# Patient Record
Sex: Male | Born: 1965 | Race: Black or African American | Hispanic: No | Marital: Married | Smoking: Former smoker
Health system: Southern US, Community
[De-identification: ages and names within clinical notes are randomized; demographics above are authoritative.]

## PROBLEM LIST (undated history)

## (undated) DIAGNOSIS — I209 Angina pectoris, unspecified: Secondary | ICD-10-CM

## (undated) DIAGNOSIS — E119 Type 2 diabetes mellitus without complications: Secondary | ICD-10-CM

## (undated) DIAGNOSIS — R0989 Other specified symptoms and signs involving the circulatory and respiratory systems: Secondary | ICD-10-CM

## (undated) DIAGNOSIS — M5430 Sciatica, unspecified side: Secondary | ICD-10-CM

## (undated) DIAGNOSIS — I10 Essential (primary) hypertension: Secondary | ICD-10-CM

## (undated) DIAGNOSIS — R51 Headache: Secondary | ICD-10-CM

## (undated) DIAGNOSIS — E7800 Pure hypercholesterolemia, unspecified: Secondary | ICD-10-CM

## (undated) DIAGNOSIS — R519 Headache, unspecified: Secondary | ICD-10-CM

## (undated) DIAGNOSIS — I2510 Atherosclerotic heart disease of native coronary artery without angina pectoris: Secondary | ICD-10-CM

## (undated) DIAGNOSIS — I201 Angina pectoris with documented spasm: Secondary | ICD-10-CM

## (undated) HISTORY — PX: INGUINAL HERNIA REPAIR: SUR1180

## (undated) HISTORY — DX: Other specified symptoms and signs involving the circulatory and respiratory systems: R09.89

---

## 2012-01-10 HISTORY — PX: CORONARY ANGIOPLASTY WITH STENT PLACEMENT: SHX49

## 2012-01-25 ENCOUNTER — Inpatient Hospital Stay (HOSPITAL_BASED_OUTPATIENT_CLINIC_OR_DEPARTMENT_OTHER)
Admission: EM | Admit: 2012-01-25 | Discharge: 2012-01-27 | DRG: 247 | Disposition: A | Discharge status: Home / Self Care | Payer: 59 | Attending: Cardiology | Admitting: Cardiology

## 2012-01-25 ENCOUNTER — Encounter (HOSPITAL_BASED_OUTPATIENT_CLINIC_OR_DEPARTMENT_OTHER): Payer: Self-pay | Admitting: *Deleted

## 2012-01-25 ENCOUNTER — Emergency Department (HOSPITAL_BASED_OUTPATIENT_CLINIC_OR_DEPARTMENT_OTHER): Payer: 59

## 2012-01-25 DIAGNOSIS — K219 Gastro-esophageal reflux disease without esophagitis: Secondary | ICD-10-CM | POA: Diagnosis present

## 2012-01-25 DIAGNOSIS — I214 Non-ST elevation (NSTEMI) myocardial infarction: Principal | ICD-10-CM | POA: Diagnosis present

## 2012-01-25 DIAGNOSIS — Z720 Tobacco use: Secondary | ICD-10-CM | POA: Diagnosis present

## 2012-01-25 DIAGNOSIS — I2510 Atherosclerotic heart disease of native coronary artery without angina pectoris: Secondary | ICD-10-CM | POA: Diagnosis present

## 2012-01-25 DIAGNOSIS — I10 Essential (primary) hypertension: Secondary | ICD-10-CM | POA: Diagnosis present

## 2012-01-25 DIAGNOSIS — F17200 Nicotine dependence, unspecified, uncomplicated: Secondary | ICD-10-CM | POA: Diagnosis present

## 2012-01-25 HISTORY — DX: Atherosclerotic heart disease of native coronary artery without angina pectoris: I25.10

## 2012-01-25 HISTORY — DX: Angina pectoris, unspecified: I20.9

## 2012-01-25 HISTORY — DX: Essential (primary) hypertension: I10

## 2012-01-25 LAB — CBC
Hemoglobin: 13.6 g/dL (ref 13.0–17.0)
MCH: 24.8 pg — ABNORMAL LOW (ref 26.0–34.0)
MCHC: 34.3 g/dL (ref 30.0–36.0)
MCV: 72.3 fL — ABNORMAL LOW (ref 78.0–100.0)
RBC: 5.49 MIL/uL (ref 4.22–5.81)

## 2012-01-25 LAB — BASIC METABOLIC PANEL
BUN: 10 mg/dL (ref 6–23)
CO2: 26 mEq/L (ref 19–32)
Calcium: 9.1 mg/dL (ref 8.4–10.5)
Creatinine, Ser: 1 mg/dL (ref 0.50–1.35)
GFR calc non Af Amer: 90 mL/min — ABNORMAL LOW (ref >90)
Glucose, Bld: 99 mg/dL (ref 70–99)
Sodium: 138 mEq/L (ref 135–145)

## 2012-01-25 LAB — CARDIAC PANEL(CRET KIN+CKTOT+MB+TROPI)
CK, MB: 9.2 ng/mL (ref 0.3–4.0)
Relative Index: 5.9 — ABNORMAL HIGH (ref 0.0–2.5)
Total CK: 156 U/L (ref 7–232)

## 2012-01-25 MED ORDER — GI COCKTAIL ~~LOC~~
30.0000 mL | Freq: Once | ORAL | Status: AC
  Administered 2012-01-25: 30 mL via ORAL
  Filled 2012-01-25: qty 30

## 2012-01-25 MED ORDER — NITROGLYCERIN 0.4 MG SL SUBL
0.4000 mg | SUBLINGUAL_TABLET | SUBLINGUAL | Status: DC | PRN
  Administered 2012-01-25: 0.4 mg via SUBLINGUAL
  Filled 2012-01-25: qty 25

## 2012-01-25 MED ORDER — ASPIRIN 81 MG PO CHEW
324.0000 mg | CHEWABLE_TABLET | Freq: Once | ORAL | Status: AC
  Administered 2012-01-25: 324 mg via ORAL
  Filled 2012-01-25: qty 4

## 2012-01-25 MED ORDER — HEPARIN BOLUS VIA INFUSION
4000.0000 [IU] | Freq: Once | INTRAVENOUS | Status: AC
  Administered 2012-01-25: 4000 [IU] via INTRAVENOUS

## 2012-01-25 MED ORDER — NITROGLYCERIN 0.4 MG SL SUBL: 0.4000 mg | SUBLINGUAL_TABLET | SUBLINGUAL | Status: DC | PRN

## 2012-01-25 MED ORDER — ONDANSETRON HCL 4 MG/2ML IJ SOLN: 4.0000 mg | Freq: Four times a day (QID) | INTRAMUSCULAR | Status: DC | PRN

## 2012-01-25 MED ORDER — FAMOTIDINE 20 MG PO TABS
20.0000 mg | ORAL_TABLET | Freq: Two times a day (BID) | ORAL | Status: DC
  Administered 2012-01-25: 20 mg via ORAL
  Filled 2012-01-25 (×3): qty 1

## 2012-01-25 MED ORDER — ATORVASTATIN CALCIUM 80 MG PO TABS
80.0000 mg | ORAL_TABLET | Freq: Every day | ORAL | Status: DC
  Administered 2012-01-25 – 2012-01-26 (×2): 80 mg via ORAL
  Filled 2012-01-25 (×4): qty 1

## 2012-01-25 MED ORDER — LISINOPRIL 20 MG PO TABS
20.0000 mg | ORAL_TABLET | Freq: Every day | ORAL | Status: DC
  Administered 2012-01-25 – 2012-01-27 (×3): 20 mg via ORAL
  Filled 2012-01-25 (×3): qty 1

## 2012-01-25 MED ORDER — HYDROCHLOROTHIAZIDE 25 MG PO TABS
25.0000 mg | ORAL_TABLET | Freq: Every day | ORAL | Status: DC
  Administered 2012-01-25 – 2012-01-27 (×2): 25 mg via ORAL
  Filled 2012-01-25 (×3): qty 1

## 2012-01-25 MED ORDER — SODIUM CHLORIDE 0.9 % IJ SOLN
3.0000 mL | Freq: Two times a day (BID) | INTRAMUSCULAR | Status: DC
  Administered 2012-01-25: 3 mL via INTRAVENOUS

## 2012-01-25 MED ORDER — LISINOPRIL-HYDROCHLOROTHIAZIDE 20-25 MG PO TABS: 1.0000 | ORAL_TABLET | Freq: Every day | ORAL | Status: DC

## 2012-01-25 MED ORDER — HEART ATTACK BOUNCING BOOK
Freq: Once | Status: AC
  Administered 2012-01-25
  Filled 2012-01-25: qty 1

## 2012-01-25 MED ORDER — ACETAMINOPHEN 325 MG PO TABS
650.0000 mg | ORAL_TABLET | ORAL | Status: DC | PRN
  Filled 2012-01-25: qty 1

## 2012-01-25 MED ORDER — METOPROLOL TARTRATE 25 MG PO TABS
25.0000 mg | ORAL_TABLET | Freq: Two times a day (BID) | ORAL | Status: DC
  Administered 2012-01-26 – 2012-01-27 (×3): 25 mg via ORAL
  Filled 2012-01-25 (×6): qty 1

## 2012-01-25 MED ORDER — SODIUM CHLORIDE 0.9 % IV SOLN: 250.0000 mL | INTRAVENOUS | Status: DC | PRN

## 2012-01-25 MED ORDER — HEPARIN (PORCINE) IN NACL 100-0.45 UNIT/ML-% IJ SOLN
1100.0000 [IU]/h | INTRAMUSCULAR | Status: DC
  Administered 2012-01-25: 1000 [IU]/h via INTRAVENOUS
  Administered 2012-01-25: 1100 [IU]/h via INTRAVENOUS
  Filled 2012-01-25 (×3): qty 250

## 2012-01-25 MED ORDER — METOPROLOL TARTRATE 50 MG PO TABS
25.0000 mg | ORAL_TABLET | Freq: Once | ORAL | Status: AC
  Administered 2012-01-25: 25 mg via ORAL
  Filled 2012-01-25: qty 1

## 2012-01-25 MED ORDER — SODIUM CHLORIDE 0.9 % IJ SOLN: 3.0000 mL | INTRAMUSCULAR | Status: DC | PRN

## 2012-01-25 MED ORDER — ASPIRIN EC 81 MG PO TBEC
81.0000 mg | DELAYED_RELEASE_TABLET | Freq: Every day | ORAL | Status: DC
  Filled 2012-01-25: qty 1

## 2012-01-25 MED ORDER — HYDROCHLOROTHIAZIDE 25 MG PO TABS: 25.0000 mg | ORAL_TABLET | Freq: Every day | ORAL | Status: DC

## 2012-01-25 MED ORDER — LISINOPRIL 20 MG PO TABS: 20.0000 mg | ORAL_TABLET | Freq: Every day | ORAL | Status: DC

## 2012-01-25 NOTE — Progress Notes (Signed)
CRITICAL VALUE ALERT  Critical value received: CKMB 9.2 Troponin 2.97  Date of notification:  01/25/2012  Time of notification:  2255  Critical value read back:yes  Nurse who received alert:  Eliane Decree  MD notified (1st page): Arvilla Meres MD  Time of first page:  2305  MD notified (2nd page):  Time of second page:  Responding MD:  Arvilla Meres MD  Time MD responded:  2308   No new orders given at this time. The pt is asymptomatic and already on a heparin drip. Will continue to monitor the pt. Sanda Linger

## 2012-01-25 NOTE — ED Notes (Signed)
[facility] RN and this RN asked patient further about his emotional state. Patient denies SI and states "as long as I can get back to work by Monday I'm good!" Pt less tearful at this time, but still not making eye contact.

## 2012-01-25 NOTE — ED Provider Notes (Signed)
History     CSN: [id number]  Arrival date & time 01/25/12  1229   First MD Initiated Contact with Patient 01/25/12 1315      Chief Complaint  Patient presents with  . Gastrophageal Reflux    (Consider location/radiation/quality/duration/timing/severity/associated sxs/prior treatment) HPI Patient presenting with chest pain with radiation to his left arm and bilateral jaw. He thought that his chest pain was related to a history of reflux. He was seen several weeks ago and prescribed famotidine for presumed GERD which did not help with his symptoms. He states that he feels full-type pressure in his midchest that is intermittent. He denies sweating or difficulty breathing or nausea. Symptoms are not related to exertion. He's had no fever or cough. Currently he has no chest pain.  There are no other associated systemic symptoms, there are no other alleviating or modifying factors.   Past Medical History  Diagnosis Date  . Hypertension     History reviewed. No pertinent past surgical history.  No family history on file.  History  Substance Use Topics  . Smoking status: Current Everyday Smoker -- 0.5 packs/day  . Smokeless tobacco: Not on file  . Alcohol Use: Yes      Review of Systems ROS reviewed and all otherwise negative except for mentioned in HPI  Allergies  Review of patient's allergies indicates no known allergies.  Home Medications   Current Outpatient Rx  Name Route Sig Dispense Refill  . ASPIRIN-ACETAMINOPHEN 500-325 MG PO PACK Oral Take 1 packet by mouth 2 (two) times daily as needed. headache    . FAMOTIDINE 20 MG PO TABS Oral Take 20 mg by mouth 2 (two) times daily.    Marland Kitchen LISINOPRIL-HYDROCHLOROTHIAZIDE 20-25 MG PO TABS Oral Take 1 tablet by mouth daily.    Marland Kitchen NAPROXEN SODIUM 220 MG PO TABS Oral Take 220 mg by mouth daily as needed.      BP 142/82  Pulse 62  Temp 98.6 F (37 C) (Oral)  Resp 16  SpO2 100% Vitals reviewed Physical Exam Physical  Examination: General appearance - alert, well appearing, and in no distress Mental status - alert, oriented to person, place, and time Eyes - no conjunctival injection, no scleral icterus Mouth - mucous membranes moist, pharynx normal without lesions Chest - clear to auscultation, no wheezes, rales or rhonchi, symmetric air entry Heart - normal rate, regular rhythm, normal S1, S2, no murmurs, rubs, clicks or gallops Abdomen - soft, nontender, nondistended, no masses or organomegaly Neurological - alert, oriented, normal speech, no focal findings or movement disorder noted Extremities - peripheral pulses normal, no pedal edema, no clubbing or cyanosis Skin - normal coloration and turgor, no rashes Psych- normal mood and affect  ED Course  Procedures (including critical care time)   Date: 01/25/2012  Rate: 72  Rhythm: normal sinus rhythm  QRS Axis: normal  Intervals: normal  ST/T Wave abnormalities: normal  Conduction Disutrbances: none  Narrative Interpretation: unremarkable  CRITICAL CARE Performed by: Ethelda Chick   Total critical care time: 40  Critical care time was exclusive of separately billable procedures and treating other patients.  Critical care was necessary to treat or prevent imminent or life-threatening deterioration.  Critical care was time spent personally by me on the following activities: development of treatment plan with patient and/or surrogate as well as nursing, discussions with consultants, evaluation of patient's response to treatment, examination of patient, obtaining history from patient or surrogate, ordering and performing treatments and interventions, ordering and review of  laboratory studies, ordering and review of radiographic studies, pulse oximetry and re-evaluation of patient's condition.       Labs Reviewed  CBC - Abnormal; Notable for the following:    MCV 72.3 (*)     MCH 24.8 (*)     All other components within normal limits    BASIC METABOLIC PANEL - Abnormal; Notable for the following:    GFR calc non Af Amer 90 (*)     All other components within normal limits  TROPONIN I - Abnormal; Notable for the following:    Troponin I 0.52 (*)     All other components within normal limits   Dg Chest 2 View  01/25/2012  *RADIOLOGY REPORT*  Clinical Data: Chest pressure radiates in the left arm.  CHEST - 2 VIEW  Comparison: None.  Findings: The lungs are clear without focal consolidation, edema, effusion or pneumothorax.  Cardiopericardial silhouette is within normal limits for size.  Imaged bony structures of the thorax are intact.  IMPRESSION: Normal exam.  Original Report Authenticated By: ERIC A. MANSELL, M.D.     1. NSTEMI (non-ST elevated myocardial infarction)       MDM  Patient presenting with chest pain which he presumed was related to his history of GERD. However in working him up today in the ED he has a positive troponin of 0.5. His chest x-ray and EKG are both normal. He is not having chest pain in the ED period he was treated with aspirin and started on a heparin infusion. I have discussed his results with the patient and also with Dr. Daleen Squibb at [facility] who has accepted the patient for transfer to a telemetry bed. I have also given the patient metoprolol.        Ethelda Chick, MD 01/25/12 1536

## 2012-01-25 NOTE — ED Notes (Signed)
Pt's status explained to patient by RN and Dr Karma Ganja, MD, pt visibly upset, tearful, sts "that's why I don't come to hospitals." RN and MD affirmed patient that he did the right thing to come in because delay could have had detrimental results; pt nodded understanding but remains tearful. Pt provided with phone to call family.

## 2012-01-25 NOTE — Progress Notes (Signed)
Patient ID: Alan Copeland, male   DOB: [birth year], 46 y.o.   MRN: [id number]   Patient ID: Alan Copeland MRN: [id number], DOB/AGE: 1966-04-05   Admit date: 01/25/2012   Primary Physician: [facility] Primary Cardiologist: New to Alan Doom MD  Pt. Profile: Alan Copeland is a 46 year old African American male admitted with a non-STEMI.    Problem List  Past Medical History  Diagnosis Date  . Hypertension     History reviewed. No pertinent past surgical history.   Allergies  No Known Allergies  HPI Patient is a 46 year old [place] American male with no cardiac history. He's had hypertension for 15 years and also smokes about 7-10 cigarettes a day. He does not know his lipids.  So the last 2 weeks, he's had intermittent Chol aching that radiates down into the center of his chest and into his left shoulder and then into his left arm. He does not associate with activity.  This morning at 2 AM he awoke with that discomfort. He thought was indigestion. When he got to the emergency room in [facility], his first troponin was positive and his discomfort was relieved with nitroglycerin. He is currently pain free. EKG did not show any ST segment elevation.  He denies any associated symptoms of nausea vomiting or diaphoresis. He does have a lot of indigestion and heartburn and felt this is what it was. He was recently put on Pepcid 20 mg a day which he says has not helped his discomfort.  He denies any illicit drug use. He occasionally drank 40 ounces of beer after a bad day at work.   Home Medications  Prior to Admission medications   Medication Sig Start Date End Date Taking? Authorizing Provider  Aspirin-Acetaminophen (GOODY BODY PAIN) 500-325 MG PACK Take 1 packet by mouth 2 (two) times daily as needed. headache   Yes Historical Provider, MD  famotidine (PEPCID) 20 MG tablet Take 20 mg by mouth 2 (two) times daily.   Yes Historical Provider, MD    lisinopril-hydrochlorothiazide (PRINZIDE,ZESTORETIC) 20-25 MG per tablet Take 1 tablet by mouth daily.   Yes Historical Provider, MD  naproxen sodium (ANAPROX) 220 MG tablet Take 220 mg by mouth daily as needed.   Yes Historical Provider, MD    Family History  Unremarkable for premature coronary disease.  Social History  He is living with his girlfriend. He works in a [employer]. He has one child was 44 years old. Again, he smokes about 7-10 cigarettes a day. He does drink 40 ounces of beer after a bad day at work. He does not binge drink. He does not use any drugs.   Review of Systems General:  No chills, fever, night sweats or weight changes.  Cardiovascular: dyspnea on exertion, edema, orthopnea, palpitations, paroxysmal nocturnal dyspnea. Dermatological: No rash, lesions/masses Respiratory: No cough, dyspnea Urologic: No hematuria, dysuria Abdominal:   No nausea, vomiting, diarrhea, bright red blood per rectum, melena, or hematemesis Neurologic:  No visual changes, wkns, changes in mental status. All other systems reviewed and are otherwise negative except as noted above.  Physical Exam  Blood pressure 174/101, pulse 60, temperature 98.5 F (36.9 C), temperature source Oral, resp. rate 20, SpO2 100.00%.  General: Pleasant, NAD Psych: Normal affect. Neuro: Alert and oriented X 3. Moves all extremities spontaneously. HEENT: Normal  Neck: Supple without bruits or JVD. Lungs:  Resp regular and unlabored, CTA. Heart: RRR no s3, positive S4, no murmurs. Abdomen: Soft, non-tender, non-distended, BS +  x 4.  Extremities: No clubbing, cyanosis or edema. DP/PT/Radials 2+ and equal bilaterally.  Labs   Basename 01/25/12 1621 01/25/12 1330  CKTOTAL -- --  CKMB -- --  TROPONINI 2.15* 0.52*   Lab Results  Component Value Date   WBC 8.2 01/25/2012   HGB 13.6 01/25/2012   HCT 39.7 01/25/2012   MCV 72.3* 01/25/2012   PLT 347 01/25/2012    Lab 01/25/12 1330  NA 138  K 4.0  CL  101  CO2 26  BUN 10  CREATININE 1.00  CALCIUM 9.1  PROT --  BILITOT --  ALKPHOS --  ALT --  AST --  GLUCOSE 99   No results found for this basename: CHOL, HDL, LDLCALC, TRIG   No results found for this basename: DDIMER     Radiology/Studies  Dg Chest 2 View  01/25/2012  *RADIOLOGY REPORT*  Clinical Data: Chest pressure radiates in the left arm.  CHEST - 2 VIEW  Comparison: None.  Findings: The lungs are clear without focal consolidation, edema, effusion or pneumothorax.  Cardiopericardial silhouette is within normal limits for size.  Imaged bony structures of the thorax are intact.  IMPRESSION: Normal exam.  Original Report Authenticated By: ERIC A. MANSELL, M.D.    ECG  Normal sinus rhythm, ST segment depression inferiorly. No old tracing to compare.  ASSESSMENT AND PLAN  46 year old gentleman with a non-STEMI. Risk factors include hypertension, tobacco use, unknown lipid status.  He also has GERD.  Plan: #1 continue aspirin, IV heparin, begin metoprolol 25 mg by mouth twice a day, and continue his antihypertensive of lisinopril and HCTZ.               #2 tobacco cessation              #3 check fasting lipids               #4 continue PPI                #5 cardiac cath on Monday. Indication, potential risk and benefits discussed. Patient agrees with plan.   Signed, Valera Castle, MD 01/25/2012, @NOW

## 2012-01-25 NOTE — Progress Notes (Addendum)
ANTICOAGULATION CONSULT NOTE - Initial Consult  Pharmacy Consult for Heparin Indication: non-STEMI  No Known Allergies  Patient Measurements: Height: 6\' 2"  (188 cm) Weight: 170 lb (77.111 kg) (estimated by patient) IBW/kg (Calculated) : 82.2  Heparin Dosing Weight: 77 kg  Vital Signs: Temp: 98.5 F (36.9 C) (08/16 1744) Temp src: Oral (08/16 1744) BP: 174/101 mmHg (08/16 1744) Pulse Rate: 60  (08/16 1744)  Labs:  Basename 01/25/12 1621 01/25/12 1330  HGB -- 13.6  HCT -- 39.7  PLT -- 347  APTT -- --  LABPROT -- --  INR -- --  HEPARINUNFRC -- --  CREATININE -- 1.00  CKTOTAL -- --  CKMB -- --  TROPONINI 2.15* 0.52*    Estimated Creatinine Clearance: 102.8 ml/min (by C-G formula based on Cr of 1).   Medical History: Past Medical History  Diagnosis Date  . Hypertension    Assessment:   Heparin therapy initiated in ED ~2:40pm with 4000 units IV bolus, then infusion at 1000 units/hr, about 13 units/kg/hr.  Dose is appropriate for body size.  Goal of Therapy:  Heparin level 0.3-0.7 units/ml Monitor platelets by anticoagulation protocol: Yes   Plan:   Continue heparin drip at 1000 units/hr.   Heparin level ~ 6 hrs after drip was begun = 9pm.   Ddaily heparin level and CBC while on heparin.  Dennie Fetters, [place] Pager: [phone] 01/25/2012,7:24 PM   Addendum:    Heparin level on 1000 units/hr = 0.33.    Low therapeutic, but may drop some by the morning.        Plan:  Increase heparin drip to 1100 units/hr.                   Next heparin level and CBC in the morning.  Scarlett Presto, RPh 01/25/12  10:43 pm

## 2012-01-25 NOTE — ED Notes (Signed)
States he is having heart burn and indigestion. He is having sharp pain in his right chest with radiation into his jaw and left shoulder. Symptoms started 3 weeks ago. He was seen at [facility] last week for same and was given a Rx for Famotidine which has not helped. EKG on arrival. Pt smells of alcohol.

## 2012-01-25 NOTE — ED Notes (Signed)
While placing second IV, RN asked patient "that wasn't too bad, was it?" because patient had been very verbal about his dislike for IVs and needles while placing the first IV. The patient responded "It doesn't matter anymore" while looking away from RN towards the wall, tearful. RN asked "what do you mean by that?" But patient wouldn't respond.

## 2012-01-26 ENCOUNTER — Encounter (HOSPITAL_COMMUNITY)
Admission: EM | Disposition: A | Discharge status: Home / Self Care | Payer: Self-pay | Source: Home / Self Care | Attending: Cardiology

## 2012-01-26 ENCOUNTER — Other Ambulatory Visit: Payer: Self-pay

## 2012-01-26 DIAGNOSIS — I214 Non-ST elevation (NSTEMI) myocardial infarction: Secondary | ICD-10-CM

## 2012-01-26 HISTORY — PX: LEFT HEART CATHETERIZATION WITH CORONARY ANGIOGRAM: SHX5451

## 2012-01-26 LAB — CARDIAC PANEL(CRET KIN+CKTOT+MB+TROPI)
CK, MB: 7.5 ng/mL (ref 0.3–4.0)
Relative Index: 5.1 — ABNORMAL HIGH (ref 0.0–2.5)
Total CK: 146 U/L (ref 7–232)

## 2012-01-26 LAB — CBC
HCT: 38.8 % — ABNORMAL LOW (ref 39.0–52.0)
Hemoglobin: 13.3 g/dL (ref 13.0–17.0)
MCHC: 34.3 g/dL (ref 30.0–36.0)
MCV: 74.2 fL — ABNORMAL LOW (ref 78.0–100.0)
RDW: 15.1 % (ref 11.5–15.5)

## 2012-01-26 LAB — MRSA PCR SCREENING: MRSA by PCR: NEGATIVE

## 2012-01-26 LAB — HEPATIC FUNCTION PANEL
AST: 25 U/L (ref 0–37)
Albumin: 3.3 g/dL — ABNORMAL LOW (ref 3.5–5.2)
Alkaline Phosphatase: 51 U/L (ref 39–117)
Bilirubin, Direct: <0.1 mg/dL (ref 0.0–0.3)
Total Bilirubin: 0.5 mg/dL (ref 0.3–1.2)

## 2012-01-26 LAB — LIPID PANEL
LDL Cholesterol: 51 mg/dL (ref 0–99)
VLDL: 10 mg/dL (ref 0–40)

## 2012-01-26 LAB — BASIC METABOLIC PANEL
BUN: 6 mg/dL (ref 6–23)
Creatinine, Ser: 0.87 mg/dL (ref 0.50–1.35)
GFR calc non Af Amer: >90 mL/min (ref >90)
Glucose, Bld: 93 mg/dL (ref 70–99)
Potassium: 3.8 mEq/L (ref 3.5–5.1)

## 2012-01-26 SURGERY — LEFT HEART CATHETERIZATION WITH CORONARY ANGIOGRAM
Anesthesia: LOCAL

## 2012-01-26 MED ORDER — SODIUM CHLORIDE 0.9 % IV SOLN
0.2500 mg/kg/h | INTRAVENOUS | Status: AC
  Filled 2012-01-26: qty 250

## 2012-01-26 MED ORDER — HEPARIN SODIUM (PORCINE) 1000 UNIT/ML IJ SOLN
INTRAMUSCULAR | Status: AC
  Filled 2012-01-26: qty 1

## 2012-01-26 MED ORDER — ASPIRIN 81 MG PO CHEW
CHEWABLE_TABLET | ORAL | Status: AC
  Filled 2012-01-26: qty 4

## 2012-01-26 MED ORDER — SODIUM CHLORIDE 0.9 % IV SOLN: 250.0000 mL | INTRAVENOUS | Status: DC | PRN

## 2012-01-26 MED ORDER — VERAPAMIL HCL 2.5 MG/ML IV SOLN
INTRAVENOUS | Status: AC
  Filled 2012-01-26: qty 2

## 2012-01-26 MED ORDER — TICAGRELOR 90 MG PO TABS
90.0000 mg | ORAL_TABLET | Freq: Two times a day (BID) | ORAL | Status: DC
Start: 2012-01-26 — End: 2012-01-27
  Administered 2012-01-26 – 2012-01-27 (×2): 90 mg via ORAL
  Filled 2012-01-26 (×3): qty 1

## 2012-01-26 MED ORDER — ASPIRIN 81 MG PO CHEW
81.0000 mg | CHEWABLE_TABLET | Freq: Every day | ORAL | Status: DC
  Administered 2012-01-27: 81 mg via ORAL
  Filled 2012-01-26: qty 1

## 2012-01-26 MED ORDER — MIDAZOLAM HCL 2 MG/2ML IJ SOLN
INTRAMUSCULAR | Status: AC
  Filled 2012-01-26: qty 2

## 2012-01-26 MED ORDER — ACETAMINOPHEN 325 MG PO TABS: 650.0000 mg | ORAL_TABLET | ORAL | Status: DC | PRN

## 2012-01-26 MED ORDER — TICAGRELOR 90 MG PO TABS
ORAL_TABLET | ORAL | Status: AC
  Administered 2012-01-26: 90 mg via ORAL
  Filled 2012-01-26: qty 2

## 2012-01-26 MED ORDER — HEPARIN (PORCINE) IN NACL 2-0.9 UNIT/ML-% IJ SOLN
INTRAMUSCULAR | Status: AC
  Filled 2012-01-26: qty 2000

## 2012-01-26 MED ORDER — SODIUM CHLORIDE 0.9 % IV SOLN: INTRAVENOUS | Status: AC

## 2012-01-26 MED ORDER — OXYCODONE-ACETAMINOPHEN 5-325 MG PO TABS: 1.0000 | ORAL_TABLET | ORAL | Status: DC | PRN

## 2012-01-26 MED ORDER — LIDOCAINE HCL (PF) 1 % IJ SOLN
INTRAMUSCULAR | Status: AC
  Filled 2012-01-26: qty 30

## 2012-01-26 MED ORDER — CLOPIDOGREL BISULFATE 75 MG PO TABS
600.0000 mg | ORAL_TABLET | ORAL | Status: AC
Start: 2012-01-26 — End: 2012-01-26
  Administered 2012-01-26: 600 mg via ORAL

## 2012-01-26 MED ORDER — FENTANYL CITRATE 0.05 MG/ML IJ SOLN
INTRAMUSCULAR | Status: AC
  Filled 2012-01-26: qty 2

## 2012-01-26 MED ORDER — NITROGLYCERIN 0.2 MG/ML ON CALL CATH LAB
INTRAVENOUS | Status: AC
  Filled 2012-01-26: qty 1

## 2012-01-26 MED ORDER — ONDANSETRON HCL 4 MG/2ML IJ SOLN: 4.0000 mg | Freq: Four times a day (QID) | INTRAMUSCULAR | Status: DC | PRN

## 2012-01-26 MED ORDER — BIVALIRUDIN 250 MG IV SOLR
INTRAVENOUS | Status: AC
  Filled 2012-01-26: qty 250

## 2012-01-26 MED ORDER — SODIUM CHLORIDE 0.9 % IJ SOLN: 3.0000 mL | INTRAMUSCULAR | Status: DC | PRN

## 2012-01-26 MED ORDER — ASPIRIN 81 MG PO CHEW
324.0000 mg | CHEWABLE_TABLET | ORAL | Status: AC
  Administered 2012-01-26: 324 mg via ORAL

## 2012-01-26 MED ORDER — SODIUM CHLORIDE 0.9 % IJ SOLN: 3.0000 mL | Freq: Two times a day (BID) | INTRAMUSCULAR | Status: DC

## 2012-01-26 NOTE — CV Procedure (Addendum)
     Diagnostic Cardiac Catheterization and PCI Report  Alan Copeland  46 y.o.  male 07-20-1965  Procedure Date: 01/26/2012 Referring Physician: [name], MD Primary Cardiologist:: Valera Castle, M.D.   PROCEDURE:  Left heart catheterization with selective coronary angiography, left ventriculogram.  INDICATIONS:  Acute coronary syndrome presenting as a non-ST elevation myocardial infarction.  The risks, benefits, and details of the procedure were explained to the patient.  The patient verbalized understanding and wanted to proceed.  Informed written consent was obtained.  PROCEDURE TECHNIQUE:  After Xylocaine anesthesia a 5 French sheath was placed in the right radial artery with a single anterior needle wall stick.   Coronary angiography was done using a 5 [place] #4 Judkins right and 5 [place] #4 Judkins left catheter.  Left ventriculography was done using a 5 [place] #4 Judkins right catheter by hand injection. 4000 units of heparin was given intravenously after the first catheter had been advanced to the ACE in and aorta.  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 This was felt to be the culprit lesion. A loading dose of Brilinta, 180 mg , as well as a weight-based bivalirudin bolus and infusion were given.  We used a 5 [place] Judkins right guide catheter and a BMW wire. We predilated with a 2.512 mm long sprinter are asked balloon to 12 atmospheres. We then positioned and deployed a 2.5 x 16 Promus element drug-eluting stent. We post dilated the stent a 3.0 mm x 15 atmospheres (effective diameter 3.2 mm). No complications occurred during the procedure. TIMI grade 3 flow was noted.  The right radial arterial sheath was removed and a wrist band was applied at 10 cc at 11:20 AM .  ACT at completion of the case was 414 seconds CONTRAST:  Total of 140 cc.  COMPLICATIONS:  None.    HEMODYNAMICS:  Aortic pressure was 126/68 mmHg; LV pressure was 137  over; LVEDP 13 mm mercury.  There was no gradient between the left ventricle and aorta.    ANGIOGRAPHIC DATA:   The left main coronary artery is normal.  The left anterior descending artery is large vessel that gives origin to a large diagonal. The vessel is transapical. The vessel is normal.  The left circumflex artery is gives origin to 2 obtuse marginal branches. The vessel is widely patent..  The right coronary artery is 99% in the mid vessel. A Shepherd's Nevin Bloodgood is present proximally and tortuosity is noted distal to the stenosis.  LEFT VENTRICULOGRAM:  Left ventricular angiogram was done in the 30 RAO by hand injection using the Judkins right catheter. Inferobasal moderate to severe hypokinesis is noted. EF is 55%.  PCI RESULTS : The mid right coronary contains a high-grade, greater than 90% stenosis with TIMI grade 2 flow. Following PCI the stenosis was reduced to 0% with TIMI grade 3 flow.  IMPRESSIONS:  1. 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  2. Successful drug-eluting stent implantation in the mid right coronary.  3. Widely patent left coronary vessels.  4. Overall normal LV function with inferobasal significant hypokinesis   RECOMMENDATION:  1. The patient received drug-eluting stent and therefore will need to antiplatelet therapy for one year.  2. Patient should be fast track and potentially eligible for discharge in the a.m.  3. Case management to help provide medication assistance.

## 2012-01-26 NOTE — Progress Notes (Signed)
Chaplain Note:  Chaplain visited with pt immediately prior to his cath procedure.  Pt was sitting up in bed, awake, alert, and anxious about the upcoming procedure.  Pt's physicians, and nurses were also at bedside   Chaplain provided spiritual comfort, support, and prayer for pt and medical staff.  Chaplain briefly visited again with the pt  Immediately following the procedure, just prior to his being transferred from the cath lab.  Both pt and staff expressed appreciation for chaplain support.  Chaplain will follow up as needed.  01/26/12 0940  Clinical Encounter Type  Visited With Patient;Health care provider  Visit Type Spiritual support;Pre-op  Referral From Physician (Physician paged Cath team for urgent non-STEMI procedure)  Spiritual Encounters  Spiritual Needs Emotional;Prayer  Stress Factors  Patient Stress Factors Lack of knowledge;Loss of control;Health changes;Major life changes;Lack of caregivers  Family Stress Factors Not reviewed (No family present at this time.)   Verdie Shire, chaplain resident [phone]

## 2012-01-26 NOTE — Progress Notes (Signed)
Dr. Jacinto Halim called this RN and indicated pt may be having acute MI. MD recommended to call [facility] for follow up. Shortly after MD called pt had c/o CP  Similar to what he had prior to admit but slightly milder per pt. EKG done. CP self resolved before EKG was started and Ntg given. Thanks  Ancil Linsey RN

## 2012-01-26 NOTE — Progress Notes (Signed)
   CARE MANAGEMENT NOTE 01/26/2012  Patient:  Alan Copeland, Alan Copeland   Account Number:  [id number]  Date Initiated:  01/26/2012  Documentation initiated by:  [facility]  Subjective/Objective Assessment:     Action/Plan:   need medication Brillinta, will check with pt's pharmacy to see if medication in stock   Anticipated DC Date:  01/27/2012   Anticipated DC Plan:  HOME/SELF CARE      DC Planning Services  CM consult  Medication Assistance      Choice offered to / List presented to:             Status of service:  In process, will continue to follow Medicare Important Message given?   (If response is "NO", the following Medicare IM given date fields will be blank) Date Medicare IM given:   Date Additional Medicare IM given:    Discharge Disposition:    Per UR Regulation:    If discussed at Long Length of Stay Meetings, dates discussed:    Comments:  01/26/2012 1700 Unit RN to provide pt with Brilinta package that has 30 day free supply card and copay card. Will need a seperate RX for free 30 day supply. NCM will follow up with pt's pharmacy in am for copay amt and if medication is available at his location. Pt's pharmacy will need actual RX to run benefit check to get copay price. Isidoro Donning RN CCM Case Mgmt phone [phone]

## 2012-01-26 NOTE — H&P (Signed)
This is an updated history and physical on [name]. He was admitted last evenin with chest discomfort. There is a several week history of intermittent discomfort at rest. Cardiac markers are positive. No evolutionary EKG changes are noted. He does qualify as ACS given his clinical presentation, positive markers and risk factors including hypertension cigarette smoking. Lipid status is not known.  The patient has her the pros and cons of catheterization and possible PCI. He understands the risks of catheterization to include death, stroke, myocardial infarction, bleeding, limb ischemia, kidney injury, allergies, among others. In scan disease risks and is willing to proceed. He also understands that emergency coronary bypass surgery may be necessary if complications with PCI.

## 2012-01-26 NOTE — Progress Notes (Signed)
ANTICOAGULATION CONSULT NOTE - Follow Up Consult  Pharmacy Consult for heparin Indication: chest pain/ACS  No Known Allergies  Patient Measurements: Height: 6\' 2"  (188 cm) Weight: 170 lb (77.111 kg) (estimated by patient) IBW/kg (Calculated) : 82.2  Heparin Dosing Weight: 77kg  Vital Signs: Temp: 98.6 F (37 C) (08/17 0600) BP: 113/68 mmHg (08/17 0600) Pulse Rate: 52  (08/17 0600)  Labs:  Basename 01/26/12 0605 01/25/12 2145 01/25/12 2141 01/25/12 1621 01/25/12 1330  HGB 13.3 -- -- -- 13.6  HCT 38.8* -- -- -- 39.7  PLT 299 -- -- -- 347  APTT -- -- -- -- --  LABPROT -- -- 13.6 -- --  INR -- -- 1.02 -- --  HEPARINUNFRC 0.49 -- 0.33 -- --  CREATININE 0.87 -- -- -- 1.00  CKTOTAL 146 156 -- -- --  CKMB 7.5* 9.2* -- -- --  TROPONINI 1.53* 2.97* -- 2.15* --    Estimated Creatinine Clearance: 118.2 ml/min (by C-G formula based on Cr of 0.87).   Assessment: 46 yo M admitted 8/16 w NSTEMI on full dose heparin. HL this am in therapeutic range at 0.49 on 1100 units/hr, now with therapeutic levels x2. CBC stable, no bleeding or line infusion issues noted in chart.  Goal of Therapy:  Heparin level 0.3-0.7 units/ml Monitor platelets by anticoagulation protocol: Yes   Plan:  -Continue heparin gtt at 1100 units/hr - F/u daily HL, CBC (watch plts) - F/u BP, HR w addition of Lopressor   Thank you for the consult.  Tomi Bamberger, PharmD Clinical Pharmacist Pager: [phone] Pharmacy: [phone] 01/26/2012 9:05 AM

## 2012-01-26 NOTE — Progress Notes (Addendum)
SUBJECTIVE: Complains of severe heartburn and jaw pain after eating breakfast. Lasted 15 minutes. Resolved on its own. No associated dyspnea, diaphoresis or nausea.  Active Problems:  MI, acute, non ST segment elevation  Hypertension  Tobacco use  GERD (gastroesophageal reflux disease)   LABS: Basic Metabolic Panel:  Basename 01/26/12 0605 01/25/12 1330  NA 139 138  K 3.8 4.0  CL 104 101  CO2 23 26  GLUCOSE 93 99  BUN 6 10  CREATININE 0.87 1.00  CALCIUM 9.2 9.1  MG -- --  PHOS -- --   Liver Function Tests:  [facility] 01/26/12 0605  AST 25  ALT 13  ALKPHOS 51  BILITOT 0.5  PROT 6.7  ALBUMIN 3.3*   CBC:  Basename 01/26/12 0605 01/25/12 1330  WBC 8.2 8.2  NEUTROABS -- --  HGB 13.3 13.6  HCT 38.8* 39.7  MCV 74.2* 72.3*  PLT 299 347   Cardiac Enzymes:  Basename 01/26/12 0605 01/25/12 2145 01/25/12 1621  CKTOTAL 146 156 --  CKMB 7.5* 9.2* --  CKMBINDEX -- -- --  TROPONINI 1.53* 2.97* 2.15*    Basename 01/26/12 0605  CHOL 160  HDL 99  LDLCALC 51  TRIG 50  CHOLHDL 1.6  LDLDIRECT --    RADIOLOGY: Dg Chest 2 View  01/25/2012  *RADIOLOGY REPORT*  Clinical Data: Chest pressure radiates in the left arm.  CHEST - 2 VIEW  Comparison: None.  Findings: The lungs are clear without focal consolidation, edema, effusion or pneumothorax.  Cardiopericardial silhouette is within normal limits for size.  Imaged bony structures of the thorax are intact.  IMPRESSION: Normal exam.  Original Report Authenticated By: ERIC A. MANSELL, M.D.     PHYSICAL EXAM BP 113/68  Pulse 52  Temp 98.6 F (37 C) (Oral)  Resp 18  Ht 6\' 2"  (1.88 m)  Wt 170 lb (77.111 kg)  BMI 21.83 kg/m2  SpO2 100% General: Well developed, well nourished, in no acute distress Head: Eyes PERRLA, No xanthomas.   Normal cephalic and atramatic  Lungs: Clear bilaterally to auscultation and percussion. Heart: HRRR S1 S2, No MRG .  Pulses are 2+ & equal.            No carotid bruit. No JVD.  No abdominal  bruits. No femoral bruits. Abdomen: Bowel sounds are positive, abdomen soft and non-tender without masses or                  Hernia's noted. Msk:  Back normal, normal gait. Normal strength and tone for age. Extremities: No clubbing, cyanosis or edema.  DP +1 Neuro: Alert and oriented X 3. Psych:  Good affect, responds appropriately  TELEMETRY: Reviewed telemetry pt in: NSR rate of 70's.   ASSESSMENT AND PLAN:  1. Unstable Angina: Continues to have recurrent chest discomfort radiation to the jaw and left arm.. Troponins are rising. He was seen and examined by [facility] and by Dr. Eden Emms. Plans to transfer and have cardiac cath ASAP.   2. NSTEMI: As above. His troponins have been rising with continued unstable angina. Will await cath results.   Bettey Mare. Lyman Bishop NP [facility] 01/26/2012, 9:20 AM  Patient examined chart reviewed.  Worrisome recurrent pain with positive enzymes.  J point elevation in precordial leads and T wave changes inferioroly.  Relief with nitro and pain radiates to jaw and down arm  Plavix 600mg  load Discussed with Dr Carolan Shiver  Prefer urgent cath this am.  Patient in agreement and signed  consent  [name] 9:43 AM 01/26/2012

## 2012-01-26 NOTE — Progress Notes (Signed)
Cardiac Rehab 1440 -1515 Pt continues to be on bedrest following radial procedure.  Pt anticipates d/c tomorrow.  Education completed at the bedside with family present.  Reviewed with pt the Heart Attack booklet.  Handouts on Heart healthy diet and exercise guidelines given.  Pt instructed on the NTG protocol and to alert 911 for any unrelieved CP.  Handout and discussion on tobacco cessation provided.  Pt expressed some anxiety and feeling overwhelmed.  Pt expressed the desire to return to work on Monday. Pt works in an environment where temperatures exceed 110 degrees.  Pt advised to talk to his MD on tomorrow about returning to work.  Pt declined offered referral to Outpatient Cardiac Rehab. Karlene Lineman RN

## 2012-01-27 LAB — BASIC METABOLIC PANEL
CO2: 21 mEq/L (ref 19–32)
Calcium: 9.6 mg/dL (ref 8.4–10.5)
GFR calc Af Amer: >90 mL/min (ref >90)
GFR calc non Af Amer: >90 mL/min (ref >90)
Sodium: 137 mEq/L (ref 135–145)

## 2012-01-27 LAB — CBC
MCH: 25 pg — ABNORMAL LOW (ref 26.0–34.0)
Platelets: 297 10*3/uL (ref 150–400)
RBC: 5.76 MIL/uL (ref 4.22–5.81)
RDW: 15.1 % (ref 11.5–15.5)

## 2012-01-27 MED ORDER — ATORVASTATIN CALCIUM 80 MG PO TABS: 80.0000 mg | ORAL_TABLET | Freq: Every day | ORAL | Status: DC

## 2012-01-27 MED ORDER — METOPROLOL TARTRATE 25 MG PO TABS: 25.0000 mg | ORAL_TABLET | Freq: Two times a day (BID) | ORAL | Status: DC

## 2012-01-27 MED ORDER — TICAGRELOR 90 MG PO TABS: 90.0000 mg | ORAL_TABLET | Freq: Two times a day (BID) | ORAL | Status: DC

## 2012-01-27 MED ORDER — NITROGLYCERIN 0.4 MG SL SUBL: 0.4000 mg | SUBLINGUAL_TABLET | SUBLINGUAL | Status: DC | PRN

## 2012-01-27 MED ORDER — ASPIRIN 81 MG PO CHEW: 81.0000 mg | CHEWABLE_TABLET | Freq: Every day | ORAL | Status: AC

## 2012-01-27 NOTE — Progress Notes (Signed)
pts clothing/shoes returned to him yesterday from 3700. Pt also received phone charger. Pt's money and wallet were retrieved from security and returned to pt yesterday also.  Pt given 30 day free supply card and 30 day prescription for brilanta. Discussed dc papers. Pt  Voiced understanding and able to explain use of nitro and when to call ems.

## 2012-01-27 NOTE — Progress Notes (Signed)
Patient ID: Alan Copeland, male   DOB: July 06, 1965, 46 y.o.   MRN: [id number] SUBJECTIVE: No chest pain since PCI  Active Problems:  MI, acute, non ST segment elevation  Hypertension  Tobacco use  GERD (gastroesophageal reflux disease)   LABS: Basic Metabolic Panel:  Basename 01/27/12 0520 01/26/12 0605  NA 137 139  K 4.0 3.8  CL 103 104  CO2 21 23  GLUCOSE 102* 93  BUN 6 6  CREATININE 0.91 0.87  CALCIUM 9.6 9.2  MG -- --  PHOS -- --   Liver Function Tests:  [facility] 01/26/12 0605  AST 25  ALT 13  ALKPHOS 51  BILITOT 0.5  PROT 6.7  ALBUMIN 3.3*   CBC:  Basename 01/27/12 0520 01/26/12 0605  WBC 8.0 8.2  NEUTROABS -- --  HGB 14.4 13.3  HCT 43.0 38.8*  MCV 74.7* 74.2*  PLT 297 299   Cardiac Enzymes:  Basename 01/26/12 0605 01/25/12 2145 01/25/12 1621  CKTOTAL 146 156 --  CKMB 7.5* 9.2* --  CKMBINDEX -- -- --  TROPONINI 1.53* 2.97* 2.15*    Basename 01/26/12 0605  CHOL 160  HDL 99  LDLCALC 51  TRIG 50  CHOLHDL 1.6  LDLDIRECT --    RADIOLOGY: Dg Chest 2 View  01/25/2012  *RADIOLOGY REPORT*  Clinical Data: Chest pressure radiates in the left arm.  CHEST - 2 VIEW  Comparison: None.  Findings: The lungs are clear without focal consolidation, edema, effusion or pneumothorax.  Cardiopericardial silhouette is within normal limits for size.  Imaged bony structures of the thorax are intact.  IMPRESSION: Normal exam.  Original Report Authenticated By: ERIC A. MANSELL, M.D.     PHYSICAL EXAM BP 120/73  Pulse 60  Temp 98.6 F (37 C) (Oral)  Resp 21  Ht 6\' 2"  (1.88 m)  Wt 171 lb 4.8 oz (77.7 kg)  BMI 21.99 kg/m2  SpO2 99% General: Well developed, well nourished, in no acute distress Head: Eyes PERRLA, No xanthomas.   Normal cephalic and atramatic  Lungs: Clear bilaterally to auscultation and percussion. Heart: HRRR S1 S2, No MRG .  Pulses are 2+ & equal.            No carotid bruit. No JVD.  No abdominal bruits. No femoral bruits. Abdomen: Bowel  sounds are positive, abdomen soft and non-tender without masses or                  Hernia's noted. Msk:  Back normal, normal gait. Normal strength and tone for age. Extremities: No clubbing, cyanosis or edema.  DP +1 Neuro: Alert and oriented X 3. Psych:  Good affect, responds appropriately  Cath site right radial A with no hematoma  TELEMETRY: Reviewed telemetry pt in: NSR rate of 70's.   ASSESSMENT AND PLAN:  SEMI:  S/P stent to mid RCA.  Continue ASA and Plavix for a year.  D/C home after lunch  F/U in [place] office with me or Dr Daleen Squibb in 3-4 weeks No work for 1 week.  Continue statin beta blocker and ACE for BP and risk factor modification.  Will check P2Y and cholesterol as outpatient   [name] 7:43 AM 01/27/2012

## 2012-01-27 NOTE — Discharge Summary (Signed)
Physician Discharge Summary  Patient ID: Alan Copeland MRN: [id number] DOB/AGE: Jan 09, 1966 46 y.o.  Admit date: 01/25/2012 Discharge date: 01/27/2012  Primary Discharge Diagnosis: NSTEMI Secondary Discharge Diagnosis: 1. CAD S/P PCI of mid RCA 2. Tobacco Abuse  Significant Diagnostic Studies:  Cardiac Cath: 01/26/2012  Consults:  IMPRESSIONS: 1. 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  2. Successful drug-eluting stent implantation in the mid right coronary.  3. Widely patent left coronary vessels.  4. Overall normal LV function with inferobasal significant hypokinesis  Hospital Course:   [name] is a 46 year old patient who was admitted with substernal chest pain described as severe heartburn radiating into his arms and jaw after several week history of same. The patient was admitted to rule out myocardial infarction with initial cardiac enzymes is negative. He has a history of hypertension as well as been treated with lisinopril and HCTZ. Cardiac enzymes began to climb with a troponin of 2.15 increasing to 2.97 and trending downward at 1.53. The patient had been plan for cardiac catheterization on Monday August 19th 2013. However, the patient had recurrent chest pain at rest negative and stable angina with EKG changes. As result of this the patient was sent to cardiac catheterization lab on 01/26/2012 and was found to have 2 high-grade obstructions in the mid right coronary artery. He had a drug-eluting stent placed to the mid right coronary artery and was started on DAPT with ASA and Brilinta. On day of discharge the patient was Copeland and examined by Dr. [name] and  was found to be stable for discharge. He was sent home with a 30 day supply prescription for Brilinta in addition to long-term prescription. He will have a followup appointment in our office in 2 weeks.  He was given smoking cessation instructions as well. On  followup appointment, the patient will have a P2Y. and cholesterol evaluation labs as outpatient.  Discharge Exam: Blood pressure 115/63, pulse 60, temperature 98.2 F (36.8 C), temperature source Oral, resp. rate 21, height 6\' 2"  (1.88 m), weight 171 lb 4.8 oz (77.7 kg), SpO2 100.00%.   Labs:   Lab Results  Component Value Date   WBC 8.0 01/27/2012   HGB 14.4 01/27/2012   HCT 43.0 01/27/2012   MCV 74.7* 01/27/2012   PLT 297 01/27/2012     Lab 01/27/12 0520 01/26/12 0605  NA 137 --  K 4.0 --  CL 103 --  CO2 21 --  BUN 6 --  CREATININE 0.91 --  CALCIUM 9.6 --  PROT -- 6.7  BILITOT -- 0.5  ALKPHOS -- 51  ALT -- 13  AST -- 25  GLUCOSE 102* --   Lab Results  Component Value Date   CKTOTAL 146 01/26/2012   CKMB 7.5* 01/26/2012   TROPONINI 1.53* 01/26/2012    Lab Results  Component Value Date   CHOL 160 01/26/2012   Lab Results  Component Value Date   HDL 99 01/26/2012   Lab Results  Component Value Date   LDLCALC 51 01/26/2012   Lab Results  Component Value Date   TRIG 50 01/26/2012   Lab Results  Component Value Date   CHOLHDL 1.6 01/26/2012   No results found for this basename: LDLDIRECT      Radiology: Dg Chest 2 View  01/25/2012  *RADIOLOGY REPORT*  Clinical Data: Chest pressure radiates in the left arm.  CHEST - 2 VIEW  Comparison: None.  Findings: The lungs are clear without  focal consolidation, edema, effusion or pneumothorax.  Cardiopericardial silhouette is within normal limits for size.  Imaged bony structures of the thorax are intact.  IMPRESSION: Normal exam.  Original Report Authenticated By: ERIC A. MANSELL, M.D.    EKG:NSR with T-wave inversion in the inferior leads.  FOLLOW UP PLANS AND APPOINTMENTS Discharge Orders    Future Orders Please Complete By Expires   Diet - low sodium heart healthy      Increase activity slowly        Medication List  As of 01/27/2012  9:40 AM   STOP taking these medications         GOODY BODY PAIN 500-325 MG Pack           TAKE these medications         aspirin 81 MG chewable tablet   Chew 1 tablet (81 mg total) by mouth daily.      atorvastatin 80 MG tablet   Commonly known as: LIPITOR   Take 1 tablet (80 mg total) by mouth daily at 6 PM.      famotidine 20 MG tablet   Commonly known as: PEPCID   Take 20 mg by mouth 2 (two) times daily.      lisinopril-hydrochlorothiazide 20-25 MG per tablet   Commonly known as: PRINZIDE,ZESTORETIC   Take 1 tablet by mouth daily.      metoprolol tartrate 25 MG tablet   Commonly known as: LOPRESSOR   Take 1 tablet (25 mg total) by mouth 2 (two) times daily.      naproxen sodium 220 MG tablet   Commonly known as: ANAPROX   Take 220 mg by mouth daily as needed.      nitroGLYCERIN 0.4 MG SL tablet   Commonly known as: NITROSTAT   Place 1 tablet (0.4 mg total) under the tongue every 5 (five) minutes x 3 doses as needed for chest pain.      Ticagrelor 90 MG Tabs tablet   Commonly known as: BRILINTA   Take 1 tablet (90 mg total) by mouth 2 (two) times daily.      Ticagrelor 90 MG Tabs tablet   Commonly known as: BRILINTA   Take 1 tablet (90 mg total) by mouth 2 (two) times daily.           Follow-up Information    Follow up with [name], MD. (Our office will call you for appointment. )    Contact information:   [address] [place] [address] [phone]            Time spent with patient to include physician time: 35 minutes Signed: Joni Reining 01/27/2012, 9:40 AM Co-Sign MD

## 2012-01-28 ENCOUNTER — Encounter: Payer: Self-pay | Admitting: *Deleted

## 2012-01-28 ENCOUNTER — Telehealth: Payer: Self-pay | Admitting: Cardiovascular Disease

## 2012-01-28 LAB — POCT ACTIVATED CLOTTING TIME: Activated Clotting Time: 414 seconds

## 2012-01-28 MED FILL — Dextrose Inj 5%: INTRAVENOUS | Qty: 50 | Status: AC

## 2012-01-28 NOTE — Telephone Encounter (Signed)
Return to work in 1 week 

## 2012-01-28 NOTE — Telephone Encounter (Signed)
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

## 2012-01-28 NOTE — Telephone Encounter (Signed)
PT AWARE MAY RETURN TO WORK IN 1 WEEK  NOTE LEFT AT FRONT DESK FOR PT TO PICK UP .Alan Copeland

## 2012-01-28 NOTE — Telephone Encounter (Signed)
Pt calling re being discharged from hospital, doesn't say when to rtn to work, has appt with nishan 02-13-12, needs to know when to rtn to work, pls call

## 2012-01-28 NOTE — Progress Notes (Signed)
Retro ur ins review. 

## 2012-02-12 ENCOUNTER — Encounter: Payer: Self-pay | Admitting: *Deleted

## 2012-02-12 ENCOUNTER — Encounter: Payer: Self-pay | Admitting: Cardiovascular Disease

## 2012-02-13 ENCOUNTER — Encounter: Payer: Self-pay | Admitting: Gastroenterology

## 2012-02-13 ENCOUNTER — Encounter: Payer: Self-pay | Admitting: Cardiovascular Disease

## 2012-02-13 ENCOUNTER — Ambulatory Visit (INDEPENDENT_AMBULATORY_CARE_PROVIDER_SITE_OTHER): Payer: 59 | Admitting: Cardiovascular Disease

## 2012-02-13 VITALS — BP 120/80 | HR 64 | Resp 12

## 2012-02-13 DIAGNOSIS — R12 Heartburn: Secondary | ICD-10-CM

## 2012-02-13 NOTE — Progress Notes (Signed)
Patient ID: Alan Copeland, male   DOB: [birth year], 46 y.o.   MRN: [id number] [name] is a 46 year old patient F/U post admit for unstable angina  Admitted 01/25/12  with substernal chest pain described as severe heartburn radiating into his arms and jaw after several week history of same. The patient was admitted to rule out myocardial infarction with initial cardiac enzymes is negative. He has a history of hypertension as well as been treated with lisinopril and HCTZ. Cardiac enzymes began to climb with a troponin of 2.15 increasing to 2.97 and trending downward at 1.53. The patient had been plan for cardiac catheterization on Monday August 19th 2013. However, the patient had recurrent chest pain at rest negative and stable angina with EKG changes. As result of this the patient was sent to cardiac catheterization lab on 01/26/2012 and was found to have 2 high-grade obstructions in the mid right coronary artery. He had a drug-eluting stent placed to the mid right coronary artery and was started on DAPT with ASA and Brilinta.  Very nervous.  Still with heart burn that is non anginal No relief with TUMS and PPI  Needs GI referral.  Samples Brillinta given  ROS: Denies fever, malais, weight loss, blurry vision, decreased visual acuity, cough, sputum, SOB, hemoptysis, pleuritic pain, palpitaitons, heartburn, abdominal pain, melena, lower extremity edema, claudication, or rash.  All other systems reviewed and negative  General: Affect appropriate Healthy:  appears stated age HEENT: normal Neck supple with no adenopathy JVP normal no bruits no thyromegaly Lungs clear with no wheezing and good diaphragmatic motion Heart:  S1/S2 no murmur, no rub, gallop or click PMI normal Abdomen: benighn, BS positve, no tenderness, no AAA no bruit.  No HSM or HJR Distal pulses intact with no bruits No edema Neuro non-focal Skin warm and dry No muscular weakness   Current Outpatient Prescriptions  Medication  Sig Dispense Refill  . aspirin 81 MG chewable tablet Chew 1 tablet (81 mg total) by mouth daily.  30 tablet  11  . atorvastatin (LIPITOR) 80 MG tablet Take 1 tablet (80 mg total) by mouth daily at 6 PM.  30 tablet  10  . famotidine (PEPCID) 20 MG tablet Take 20 mg by mouth 2 (two) times daily.      Marland Kitchen lisinopril-hydrochlorothiazide (PRINZIDE,ZESTORETIC) 20-25 MG per tablet Take 1 tablet by mouth daily.      . metoprolol tartrate (LOPRESSOR) 25 MG tablet Take 1 tablet (25 mg total) by mouth 2 (two) times daily.  60 tablet  11  . naproxen sodium (ANAPROX) 220 MG tablet Take 220 mg by mouth daily as needed.      . nitroGLYCERIN (NITROSTAT) 0.4 MG SL tablet Place 1 tablet (0.4 mg total) under the tongue every 5 (five) minutes x 3 doses as needed for chest pain.  30 tablet  6  . Ticagrelor (BRILINTA) 90 MG TABS tablet Take 1 tablet (90 mg total) by mouth 2 (two) times daily.  60 tablet  11  . Ticagrelor (BRILINTA) 90 MG TABS tablet Take 1 tablet (90 mg total) by mouth 2 (two) times daily.  60 tablet  0    Allergies  Review of patient's allergies indicates no known allergies.  Electrocardiogram:  Assessment and Plan

## 2012-02-13 NOTE — Assessment & Plan Note (Signed)
Continue PPI and add gaviscon Refer to GI for EGD

## 2012-02-13 NOTE — Patient Instructions (Signed)
Your physician recommends that you schedule a follow-up appointment in: 3 MONTHS WITH DR Eden Emms You have been referred to GI DX PERSISTENT HEARTBURN Your physician recommends that you continue on your current medications as directed. Please refer to the Current Medication list given to you today.  MAY TRY GAVISCON  OVER THE COUNTER  AS DIRECTED ON  PACKAGE

## 2012-02-13 NOTE — Assessment & Plan Note (Signed)
Stable with no angina and good activity level.  Continue medical Rx Check chol and lft's oin 3 months

## 2012-02-13 NOTE — Assessment & Plan Note (Signed)
Well controlled.  Continue current medications and low sodium Dash type diet.    

## 2012-03-10 ENCOUNTER — Ambulatory Visit: Payer: 59 | Admitting: Gastroenterology

## 2012-03-11 ENCOUNTER — Telehealth: Payer: Self-pay | Admitting: Cardiovascular Disease

## 2012-03-11 MED ORDER — CLOPIDOGREL BISULFATE 75 MG PO TABS: 75.0000 mg | ORAL_TABLET | Freq: Every day | ORAL | Status: DC

## 2012-03-11 NOTE — Telephone Encounter (Signed)
New problem:  Patient was told to call back to see if he can afford the medication . It's not cover under his insurance .  Brilinta 90 mg. Only have few pills left. Need an alternative.

## 2012-03-11 NOTE — Telephone Encounter (Signed)
Will forward to Dr Eden Emms for review .Alan Copeland

## 2012-03-11 NOTE — Telephone Encounter (Signed)
Generic plavix is fine Check P2Y 3 weeks after starting

## 2012-03-11 NOTE — Telephone Encounter (Signed)
PT AWARE  TO TRY GEN PLAVIX   ALSO  NEEDS TO HAVE P2Y DONE IN  3 WEEKS   AFTER STARTING MED   PT  INSTRUCTED TO CHECK  IF  P2 Y IS DONE AT LOCAL FACILITY  IF NOT THEN NEEDS TO COME TO  [facility].

## 2012-03-11 NOTE — Telephone Encounter (Signed)
LMTCB ./CY 

## 2012-03-11 NOTE — Telephone Encounter (Signed)
F/u   Patient returning nurse call, he can be reached at [phone]

## 2012-03-12 ENCOUNTER — Ambulatory Visit: Payer: 59 | Admitting: Gastroenterology

## 2012-03-26 ENCOUNTER — Telehealth: Payer: Self-pay | Admitting: Cardiovascular Disease

## 2012-03-26 NOTE — Telephone Encounter (Signed)
Fine to take allergy pill If symptoms persist can try to change to effient

## 2012-03-26 NOTE — Telephone Encounter (Signed)
New Problem:    Patient called in because since beginning his clopidogrel (PLAVIX) 75 MG tablet he has been having terrible headaches and numbness in his fingertips.  Please call back.

## 2012-03-26 NOTE — Telephone Encounter (Signed)
LMTCB ./CY 

## 2012-03-26 NOTE — Telephone Encounter (Signed)
DISCUSSED MESSAGE WITH PT PER PT C/O HEADACHE OVER RIGHT EYE, DIZZY AFTER  STARTING PLAVIX 3 DAYS LATER   , AND ALSO NOTED SNEEZING  INFORMED PT THESE SYMPTOMS COULD BE ALLERGY RELATED  INSTRUCTED FOR PT TO TRY AND  TAKE CLARITIN OR ANOTHER  SIMILAR MED   TO  SEE IF THIS WOULD HELP B/P IS RUNNING 120/71. PT TO CALL  WITH UPDATE IF NO IMPROVEMENT PT AGREES WITH PLAN . ALSO C/O PAIN TO FINGERS  AND THEN NUMBNESS PT CURRENTLY TAKING ASPIRIN  WILL CONT TO MONITOR  IF NO IMPROVEMENT   AS WELL  WILL FORWARD TO DR Eden Emms FOR REVIEW /CY

## 2012-03-26 NOTE — Telephone Encounter (Signed)
PT  AWARE./CY 

## 2012-04-16 ENCOUNTER — Emergency Department (HOSPITAL_BASED_OUTPATIENT_CLINIC_OR_DEPARTMENT_OTHER)
Admission: EM | Admit: 2012-04-16 | Discharge: 2012-04-16 | Disposition: A | Discharge status: Home / Self Care | Payer: 59 | Attending: Emergency Medicine | Admitting: Emergency Medicine

## 2012-04-16 ENCOUNTER — Emergency Department (HOSPITAL_BASED_OUTPATIENT_CLINIC_OR_DEPARTMENT_OTHER): Payer: 59

## 2012-04-16 ENCOUNTER — Encounter (HOSPITAL_BASED_OUTPATIENT_CLINIC_OR_DEPARTMENT_OTHER): Payer: Self-pay

## 2012-04-16 DIAGNOSIS — Z79899 Other long term (current) drug therapy: Secondary | ICD-10-CM | POA: Insufficient documentation

## 2012-04-16 DIAGNOSIS — I10 Essential (primary) hypertension: Secondary | ICD-10-CM | POA: Insufficient documentation

## 2012-04-16 DIAGNOSIS — IMO0001 Reserved for inherently not codable concepts without codable children: Secondary | ICD-10-CM | POA: Insufficient documentation

## 2012-04-16 DIAGNOSIS — R42 Dizziness and giddiness: Secondary | ICD-10-CM | POA: Insufficient documentation

## 2012-04-16 DIAGNOSIS — R51 Headache: Secondary | ICD-10-CM | POA: Insufficient documentation

## 2012-04-16 DIAGNOSIS — Z7982 Long term (current) use of aspirin: Secondary | ICD-10-CM | POA: Insufficient documentation

## 2012-04-16 DIAGNOSIS — I252 Old myocardial infarction: Secondary | ICD-10-CM | POA: Insufficient documentation

## 2012-04-16 DIAGNOSIS — Z9861 Coronary angioplasty status: Secondary | ICD-10-CM | POA: Insufficient documentation

## 2012-04-16 DIAGNOSIS — F17200 Nicotine dependence, unspecified, uncomplicated: Secondary | ICD-10-CM | POA: Insufficient documentation

## 2012-04-16 DIAGNOSIS — M791 Myalgia, unspecified site: Secondary | ICD-10-CM

## 2012-04-16 DIAGNOSIS — E876 Hypokalemia: Secondary | ICD-10-CM

## 2012-04-16 LAB — COMPREHENSIVE METABOLIC PANEL
ALT: 19 U/L (ref 0–53)
AST: 20 U/L (ref 0–37)
Albumin: 4.2 g/dL (ref 3.5–5.2)
CO2: 25 mEq/L (ref 19–32)
Chloride: 100 mEq/L (ref 96–112)
GFR calc non Af Amer: 90 mL/min — ABNORMAL LOW (ref >90)
Potassium: 3.3 mEq/L — ABNORMAL LOW (ref 3.5–5.1)
Sodium: 137 mEq/L (ref 135–145)
Total Bilirubin: 0.2 mg/dL — ABNORMAL LOW (ref 0.3–1.2)

## 2012-04-16 LAB — CBC WITH DIFFERENTIAL/PLATELET
Basophils Absolute: 0 10*3/uL (ref 0.0–0.1)
Basophils Relative: 0 % (ref 0–1)
HCT: 38.2 % — ABNORMAL LOW (ref 39.0–52.0)
Lymphocytes Relative: 41 % (ref 12–46)
MCHC: 34 g/dL (ref 30.0–36.0)
Neutro Abs: 4 10*3/uL (ref 1.7–7.7)
Neutrophils Relative %: 44 % (ref 43–77)
Platelets: 345 10*3/uL (ref 150–400)
RDW: 15.2 % (ref 11.5–15.5)
WBC: 9.2 10*3/uL (ref 4.0–10.5)

## 2012-04-16 LAB — PROTIME-INR
INR: 0.9 (ref 0.00–1.49)
Prothrombin Time: 12.1 seconds (ref 11.6–15.2)

## 2012-04-16 MED ORDER — POTASSIUM CHLORIDE CRYS ER 20 MEQ PO TBCR
40.0000 meq | EXTENDED_RELEASE_TABLET | Freq: Once | ORAL | Status: AC
  Administered 2012-04-16: 40 meq via ORAL
  Filled 2012-04-16: qty 2

## 2012-04-16 NOTE — ED Provider Notes (Signed)
History     CSN: [id number]  Arrival date & time 04/16/12  4098   First MD Initiated Contact with Patient 04/16/12 1019      Chief Complaint  Patient presents with  . Arm Pain  . Leg Pain  . Dizziness    (Consider location/radiation/quality/duration/timing/severity/associated sxs/prior treatment) Patient is a 46 y.o. male presenting with arm pain and leg pain.  Arm Pain  Leg Pain     Patient states dizzy and headaches and left arm and left leg pain for 3 weeks.  No change in above complaints for several weeks.  No change in chest pain states he has had no change in this.  Drinks 40 oz beer once a week.  No pmd.  Cardiologist is Dr. Eden Emms s/p nstemi August.  "I'm just curious.  See two weeks ago my bossman had a heart attack and died right in front of me.  I know a girl who had a blood clot that went to her lungs and she died.  I talked to my uncle and he lost his leg because of a blood clot in his leg.  He told me I should be having my blood checked if I'm on blood thinners.  I just basically want to check and make sure my blood is ok.  I'm hurting in my leg and arm every day.  I don't think it's a heart attack, I just don't know what it is.  For some reason I think my blood is too thin.  I'm pretty much just scared up, it's hard for me to get out of my mind seeing my boss man die. "     Past Medical History  Diagnosis Date  . Hypertension   . Anginal pain   . NSTEMI (non-ST elevated myocardial infarction) 01/25/2012    "light"  . Headache     "letting me know when my blood pressure's up"    Past Surgical History  Procedure Date  . Inguinal hernia repair ~ 1985    left  . Coronary angioplasty with stent placement     No family history on file.  History  Substance Use Topics  . Smoking status: Current Some Day Smoker -- 0.5 packs/day for 29 years    Types: Cigarettes  . Smokeless tobacco: Never Used     Comment: 01/25/2012 offered smoking cessation materials; pt  declined  . Alcohol Use: 2.4 oz/week    4 Cans of beer per week     Comment: beer 2 x weekly      Review of Systems  Constitutional: Negative.   Eyes: Negative.   Respiratory: Negative.   Cardiovascular: Negative.   Gastrointestinal: Negative.   Genitourinary: Negative.   Musculoskeletal: Negative.   Neurological: Negative.   Hematological: Negative.   Psychiatric/Behavioral: Negative.     Allergies  Review of patient's allergies indicates no known allergies.  Home Medications   Current Outpatient Rx  Name  Route  Sig  Dispense  Refill  . LISINOPRIL-HYDROCHLOROTHIAZIDE 20-12.5 MG PO TABS   Oral   Take 1 tablet by mouth daily.         . ASPIRIN 81 MG PO CHEW   Oral   Chew 1 tablet (81 mg total) by mouth daily.   30 tablet   11   . ATORVASTATIN CALCIUM 80 MG PO TABS   Oral   Take 1 tablet (80 mg total) by mouth daily at 6 PM.   30 tablet   10   .  CLOPIDOGREL BISULFATE 75 MG PO TABS   Oral   Take 1 tablet (75 mg total) by mouth daily.   90 tablet   3   . LISINOPRIL-HYDROCHLOROTHIAZIDE 20-25 MG PO TABS   Oral   Take 1 tablet by mouth daily.         Marland Kitchen METOPROLOL TARTRATE 25 MG PO TABS   Oral   Take 1 tablet (25 mg total) by mouth 2 (two) times daily.   60 tablet   11   . NITROGLYCERIN 0.4 MG SL SUBL   Sublingual   Place 1 tablet (0.4 mg total) under the tongue every 5 (five) minutes x 3 doses as needed for chest pain.   30 tablet   6     BP 168/91  Pulse 78  Temp 97.9 F (36.6 C) (Oral)  Resp 18  Ht 6\' 2"  (1.88 m)  Wt 165 lb (74.844 kg)  BMI 21.18 kg/m2  SpO2 100%  Physical Exam  Vitals reviewed. Constitutional: He is oriented to person, place, and time. He appears well-developed and well-nourished.  HENT:  Head: Normocephalic.  Eyes: Conjunctivae normal are normal. Pupils are equal, round, and reactive to light.  Neck: Normal range of motion. Neck supple.  Cardiovascular: Normal rate.   Pulmonary/Chest: Effort normal and breath  sounds normal.  Abdominal: Soft. Bowel sounds are normal.  Musculoskeletal: Normal range of motion.  Neurological: He is alert and oriented to person, place, and time. He has normal reflexes.  Skin: Skin is warm and dry.  Psychiatric: His behavior is normal. Thought content normal.    ED Course  Procedures (including critical care time)  Labs Reviewed  CBC WITH DIFFERENTIAL - Abnormal; Notable for the following:    HCT 38.2 (*)     MCV 70.1 (*)     MCH 23.9 (*)     All other components within normal limits  COMPREHENSIVE METABOLIC PANEL  TROPONIN I   Dg Chest 2 View  04/16/2012  *RADIOLOGY REPORT*  Clinical Data: Arm pain.  Dizziness.  CHEST - 2 VIEW  Comparison: 01/25/2012  Findings: The heart size and mediastinal contours are within normal limits.  Both lungs are clear.  The visualized skeletal structures are unremarkable.  IMPRESSION: Negative examination.   Original Report Authenticated By: Signa Kell, M.D.      No diagnosis found.  Date: 04/16/2012  Rate: 96  Rhythm: normal sinus rhythm  QRS Axis: normal  Intervals: normal  ST/T Wave abnormalities: nonspecific ST/T changes  Conduction Disutrbances:none  Narrative Interpretation:   Old EKG Reviewed: unchanged Except rate increased by 32 b/minute    MDM   Results for orders placed during the hospital encounter of 04/16/12  CBC WITH DIFFERENTIAL      Component Value Range   WBC 9.2  4.0 - 10.5 K/uL   RBC 5.45  4.22 - 5.81 MIL/uL   Hemoglobin 13.0  13.0 - 17.0 g/dL   HCT 16.1 (*) 09.6 - 04.5 %   MCV 70.1 (*) 78.0 - 100.0 fL   MCH 23.9 (*) 26.0 - 34.0 pg   MCHC 34.0  30.0 - 36.0 g/dL   RDW 40.9  81.1 - 91.4 %   Platelets 345  150 - 400 K/uL   Neutrophils Relative 44  43 - 77 %   Neutro Abs 4.0  1.7 - 7.7 K/uL   Lymphocytes Relative 41  12 - 46 %   Lymphs Abs 3.7  0.7 - 4.0 K/uL   Monocytes Relative 10  3 - 12 %   Monocytes Absolute 0.9  0.1 - 1.0 K/uL   Eosinophils Relative 5  0 - 5 %   Eosinophils  Absolute 0.5  0.0 - 0.7 K/uL   Basophils Relative 0  0 - 1 %   Basophils Absolute 0.0  0.0 - 0.1 K/uL  COMPREHENSIVE METABOLIC PANEL      Component Value Range   Sodium 137  135 - 145 mEq/L   Potassium 3.3 (*) 3.5 - 5.1 mEq/L   Chloride 100  96 - 112 mEq/L   CO2 25  19 - 32 mEq/L   Glucose, Bld 108 (*) 70 - 99 mg/dL   BUN 13  6 - 23 mg/dL   Creatinine, Ser 1.61  0.50 - 1.35 mg/dL   Calcium 9.2  8.4 - 09.6 mg/dL   Total Protein 7.9  6.0 - 8.3 g/dL   Albumin 4.2  3.5 - 5.2 g/dL   AST 20  0 - 37 U/L   ALT 19  0 - 53 U/L   Alkaline Phosphatase 58  39 - 117 U/L   Total Bilirubin 0.2 (*) 0.3 - 1.2 mg/dL   GFR calc non Af Amer 90 (*) >90 mL/min   GFR calc Af Amer >90  >90 mL/min  TROPONIN I      Component Value Range   Troponin I <0.30  <0.30 ng/mL  PROTIME-INR      Component Value Range   Prothrombin Time 12.1  11.6 - 15.2 seconds   INR 0.90  0.00 - 1.49   Patient advised regarding mild hypokalemia and oral repletion.  Patient referred for primary care and advised regarding symptoms which do not appear new or c.w. Cardiac etiology.  Counseled regarding smoking, diet and exercise.         Hilario Quarry, MD 04/16/12 [phone]

## 2012-04-16 NOTE — ED Notes (Signed)
MD at bedside. 

## 2012-04-16 NOTE — ED Notes (Signed)
Pt alsp reports intermittent dizziness after taking medication.

## 2012-04-16 NOTE — ED Notes (Signed)
Pt reports intermittent left arm and left leg pain x 2 weeks.

## 2012-05-06 ENCOUNTER — Ambulatory Visit: Payer: 59 | Admitting: Gastroenterology

## 2012-05-19 ENCOUNTER — Ambulatory Visit: Payer: 59 | Admitting: Cardiovascular Disease

## 2012-06-18 ENCOUNTER — Encounter: Payer: Self-pay | Admitting: Cardiovascular Disease

## 2012-06-18 ENCOUNTER — Ambulatory Visit (INDEPENDENT_AMBULATORY_CARE_PROVIDER_SITE_OTHER): Payer: 59 | Admitting: Cardiovascular Disease

## 2012-06-18 VITALS — BP 169/89 | HR 89 | Ht 74.0 in | Wt 166.0 lb

## 2012-06-18 DIAGNOSIS — Z720 Tobacco use: Secondary | ICD-10-CM

## 2012-06-18 DIAGNOSIS — N529 Male erectile dysfunction, unspecified: Secondary | ICD-10-CM | POA: Insufficient documentation

## 2012-06-18 DIAGNOSIS — I214 Non-ST elevation (NSTEMI) myocardial infarction: Secondary | ICD-10-CM

## 2012-06-18 DIAGNOSIS — F17200 Nicotine dependence, unspecified, uncomplicated: Secondary | ICD-10-CM

## 2012-06-18 DIAGNOSIS — I219 Acute myocardial infarction, unspecified: Secondary | ICD-10-CM

## 2012-06-18 DIAGNOSIS — I10 Essential (primary) hypertension: Secondary | ICD-10-CM

## 2012-06-18 MED ORDER — METOPROLOL TARTRATE 25 MG PO TABS: 25.0000 mg | ORAL_TABLET | Freq: Every day | ORAL | Status: DC

## 2012-06-18 NOTE — Assessment & Plan Note (Signed)
Counseled for less than 10 minutes Given  1800 quit [place] number.  Risk of recurrent MI discussed

## 2012-06-18 NOTE — Assessment & Plan Note (Signed)
Will decrease lopressor to daily and see if this helps with his fatigue and erectile dysfunction

## 2012-06-18 NOTE — Progress Notes (Signed)
Patient ID: Alan Copeland, male   DOB: January 27, 1966, 47 y.o.   MRN: [id number] Alan Copeland is a 47 year old patient F/U post admit for unstable angina Admitted 01/25/12 with substernal chest pain described as severe heartburn radiating into his arms and jaw after several week history of same. The patient was admitted to rule out myocardial infarction with initial cardiac enzymes is negative. He has a history of hypertension as well as been treated with lisinopril and HCTZ. Cardiac enzymes began to climb with a troponin of 2.15 increasing to 2.97 and trending downward at 1.53. The patient had been plan for cardiac catheterization on Monday August 19th 2013. However, the patient had recurrent chest pain at rest negative and stable angina with EKG changes. As result of this the patient was sent to cardiac catheterization lab on 01/26/2012 and was found to have 2 high-grade obstructions in the mid right coronary artery. He had a drug-eluting stent placed to the mid right coronary artery and was started on DAPT with ASA and Plavix  Feels fatigue and difficulty with erections  Aggravated by girlfriend and going back to be with wife    ROS: Denies fever, malais, weight loss, blurry vision, decreased visual acuity, cough, sputum, SOB, hemoptysis, pleuritic pain, palpitaitons, heartburn, abdominal pain, melena, lower extremity edema, claudication, or rash.  All other systems reviewed and negative  General: Affect appropriate Healthy:  appears stated age HEENT: normal Neck supple with no adenopathy JVP normal no bruits no thyromegaly Lungs clear with no wheezing and good diaphragmatic motion Heart:  S1/S2 SEM murmur, no rub, gallop or click PMI normal Abdomen: benighn, BS positve, no tenderness, no AAA no bruit.  No HSM or HJR Distal pulses intact with no bruits No edema Neuro non-focal Skin warm and dry No muscular weakness   Current Outpatient Prescriptions  Medication Sig Dispense Refill  .  aspirin 81 MG chewable tablet Chew 1 tablet (81 mg total) by mouth daily.  30 tablet  11  . atorvastatin (LIPITOR) 80 MG tablet Take 1 tablet (80 mg total) by mouth daily at 6 PM.  30 tablet  10  . clopidogrel (PLAVIX) 75 MG tablet Take 1 tablet (75 mg total) by mouth daily.  90 tablet  3  . lisinopril-hydrochlorothiazide (PRINZIDE,ZESTORETIC) 20-12.5 MG per tablet Take 2 tablets by mouth daily.       . metoprolol tartrate (LOPRESSOR) 25 MG tablet Take 1 tablet (25 mg total) by mouth 2 (two) times daily.  60 tablet  11  . nitroGLYCERIN (NITROSTAT) 0.4 MG SL tablet Place 1 tablet (0.4 mg total) under the tongue every 5 (five) minutes x 3 doses as needed for chest pain.  30 tablet  6    Allergies  Review of patient's allergies indicates no known allergies.  Electrocardiogram:  Assessment and Plan

## 2012-06-18 NOTE — Patient Instructions (Signed)
Decrease Metoprolol to 25mg  once daily.  Your physician wants you to follow-up in: 6 months with Dr Eden Emms.  You will receive a reminder letter in the mail two months in advance. If you don't receive a letter, please call our office to schedule the follow-up appointment.

## 2012-06-18 NOTE — Assessment & Plan Note (Signed)
Continue DAT until 8/14  No angina stable

## 2012-06-18 NOTE — Assessment & Plan Note (Signed)
Well controlled.  Continue current medications and low sodium Dash type diet.    

## 2012-07-03 ENCOUNTER — Telehealth: Payer: Self-pay | Admitting: Gastroenterology

## 2012-07-03 NOTE — Telephone Encounter (Signed)
Message copied by Arna Snipe on Thu Jul 03, 2012  4:25 PM ------      Message from: Donata Duff      Created: Tue May 06, 2012  9:20 AM       Do not bill

## 2012-08-18 ENCOUNTER — Emergency Department (HOSPITAL_BASED_OUTPATIENT_CLINIC_OR_DEPARTMENT_OTHER)
Admission: EM | Admit: 2012-08-18 | Discharge: 2012-08-18 | Disposition: A | Discharge status: Home / Self Care | Payer: 59 | Attending: Emergency Medicine | Admitting: Emergency Medicine

## 2012-08-18 ENCOUNTER — Emergency Department (HOSPITAL_BASED_OUTPATIENT_CLINIC_OR_DEPARTMENT_OTHER): Payer: 59

## 2012-08-18 ENCOUNTER — Other Ambulatory Visit: Payer: Self-pay

## 2012-08-18 ENCOUNTER — Encounter (HOSPITAL_BASED_OUTPATIENT_CLINIC_OR_DEPARTMENT_OTHER): Payer: Self-pay | Admitting: Family Medicine

## 2012-08-18 DIAGNOSIS — R739 Hyperglycemia, unspecified: Secondary | ICD-10-CM

## 2012-08-18 DIAGNOSIS — I252 Old myocardial infarction: Secondary | ICD-10-CM | POA: Insufficient documentation

## 2012-08-18 DIAGNOSIS — Z8679 Personal history of other diseases of the circulatory system: Secondary | ICD-10-CM | POA: Insufficient documentation

## 2012-08-18 DIAGNOSIS — F17200 Nicotine dependence, unspecified, uncomplicated: Secondary | ICD-10-CM | POA: Insufficient documentation

## 2012-08-18 DIAGNOSIS — R7309 Other abnormal glucose: Secondary | ICD-10-CM | POA: Insufficient documentation

## 2012-08-18 DIAGNOSIS — Z7902 Long term (current) use of antithrombotics/antiplatelets: Secondary | ICD-10-CM | POA: Insufficient documentation

## 2012-08-18 DIAGNOSIS — Z7982 Long term (current) use of aspirin: Secondary | ICD-10-CM | POA: Insufficient documentation

## 2012-08-18 DIAGNOSIS — I10 Essential (primary) hypertension: Secondary | ICD-10-CM | POA: Insufficient documentation

## 2012-08-18 DIAGNOSIS — R079 Chest pain, unspecified: Secondary | ICD-10-CM

## 2012-08-18 DIAGNOSIS — Z79899 Other long term (current) drug therapy: Secondary | ICD-10-CM | POA: Insufficient documentation

## 2012-08-18 LAB — COMPREHENSIVE METABOLIC PANEL
ALT: 21 U/L (ref 0–53)
AST: 22 U/L (ref 0–37)
Albumin: 4 g/dL (ref 3.5–5.2)
CO2: 25 mEq/L (ref 19–32)
Chloride: 98 mEq/L (ref 96–112)
Creatinine, Ser: 1 mg/dL (ref 0.50–1.35)
Sodium: 135 mEq/L (ref 135–145)
Total Bilirubin: 0.3 mg/dL (ref 0.3–1.2)

## 2012-08-18 LAB — CBC
Hemoglobin: 12.9 g/dL — ABNORMAL LOW (ref 13.0–17.0)
MCH: 24.3 pg — ABNORMAL LOW (ref 26.0–34.0)
MCHC: 33.9 g/dL (ref 30.0–36.0)
MCV: 71.6 fL — ABNORMAL LOW (ref 78.0–100.0)
Platelets: 345 10*3/uL (ref 150–400)
RBC: 5.31 MIL/uL (ref 4.22–5.81)

## 2012-08-18 MED ORDER — SODIUM CHLORIDE 0.9 % IV SOLN: INTRAVENOUS | Status: DC

## 2012-08-18 NOTE — ED Notes (Signed)
Pt denies chest pain at present.  

## 2012-08-18 NOTE — ED Provider Notes (Signed)
History     CSN: [id number]  Arrival date & time 08/18/12  0917   First MD Initiated Contact with Patient 08/18/12 [phone]      Chief Complaint  Patient presents with  . Chest Pain    (Consider location/radiation/quality/duration/timing/severity/associated sxs/prior treatment) Patient is a 47 y.o. male presenting with chest pain. The history is provided by the patient.  Chest Pain Associated symptoms: no abdominal pain, no back pain, no cough, no fever, no headache and no shortness of breath   pt w hx cad/stent 8/13 c/o mid cp for past few days. Constant, esp constant since yesterday am. Dull. Non radiating. No specific exacerbating or alleviating factors. At rest. No relation to exertion or activity. No associated nv, diaphoresis or sob. Denies fever or chills. No cough or uri c/o.  No pleuritic pain. Denies leg pain or swelling. No dvt or pe hx. Denies chest wall injury or strain. ?hx  gerd, states had recent gi workup for same including 'stretching of esophagus', and egd.  States saw hh, denies severe esophagitis. Smoker. Occasional thc use, denies other drug use. States is unlike pain at stent, pt states then had neck and jaw pain.     Past Medical History  Diagnosis Date  . Hypertension   . Anginal pain   . NSTEMI (non-ST elevated myocardial infarction) 01/25/2012    "light"  . Headache     "letting me know when my blood pressure's up"    Past Surgical History  Procedure Laterality Date  . Inguinal hernia repair  ~ 1985    left  . Coronary angioplasty with stent placement      No family history on file.  History  Substance Use Topics  . Smoking status: Current Some Day Smoker -- 0.50 packs/day for 29 years    Types: Cigarettes  . Smokeless tobacco: Never Used     Comment: 01/25/2012 offered smoking cessation materials; pt declined  . Alcohol Use: 2.4 oz/week    4 Cans of beer per week     Comment: beer 2 x weekly      Review of Systems  Constitutional: Negative  for fever.  HENT: Negative for neck pain.   Eyes: Negative for redness.  Respiratory: Negative for cough and shortness of breath.   Cardiovascular: Positive for chest pain.  Gastrointestinal: Negative for abdominal pain.  Genitourinary: Negative for flank pain.  Musculoskeletal: Negative for back pain.  Skin: Negative for rash.  Neurological: Negative for headaches.  Hematological: Does not bruise/bleed easily.  Psychiatric/Behavioral: Negative for confusion.    Allergies  Review of patient's allergies indicates no known allergies.  Home Medications   Current Outpatient Rx  Name  Route  Sig  Dispense  Refill  . aspirin 81 MG chewable tablet   Oral   Chew 1 tablet (81 mg total) by mouth daily.   30 tablet   11   . atorvastatin (LIPITOR) 80 MG tablet   Oral   Take 1 tablet (80 mg total) by mouth daily at 6 PM.   30 tablet   10   . clopidogrel (PLAVIX) 75 MG tablet   Oral   Take 1 tablet (75 mg total) by mouth daily.   90 tablet   3   . lisinopril-hydrochlorothiazide (PRINZIDE,ZESTORETIC) 20-12.5 MG per tablet   Oral   Take 2 tablets by mouth daily.          . metoprolol tartrate (LOPRESSOR) 25 MG tablet   Oral   Take 1 tablet (  25 mg total) by mouth daily.   30 tablet   11   . nitroGLYCERIN (NITROSTAT) 0.4 MG SL tablet   Sublingual   Place 1 tablet (0.4 mg total) under the tongue every 5 (five) minutes x 3 doses as needed for chest pain.   30 tablet   6     BP 145/80  Pulse 87  Temp(Src) 97.3 F (36.3 C) (Oral)  Resp 16  Wt 166 lb (75.297 kg)  BMI 21.3 kg/m2  SpO2 100%  Physical Exam  Nursing note and vitals reviewed. Constitutional: He is oriented to person, place, and time. He appears well-developed and well-nourished. No distress.  HENT:  Nose: Nose normal.  Mouth/Throat: Oropharynx is clear and moist.  Eyes: Conjunctivae are normal. No scleral icterus.  Neck: Neck supple. No tracheal deviation present.  Cardiovascular: Normal rate, regular  rhythm, normal heart sounds and intact distal pulses.  Exam reveals no gallop and no friction rub.   No murmur heard. Pulmonary/Chest: Effort normal. No accessory muscle usage. No respiratory distress. He exhibits no tenderness.  Abdominal: Soft. He exhibits no distension. There is no tenderness.  Musculoskeletal: Normal range of motion. He exhibits no edema and no tenderness.  Neurological: He is alert and oriented to person, place, and time.  Skin: Skin is warm and dry. He is not diaphoretic.  Psychiatric: He has a normal mood and affect.    ED Course  Procedures (including critical care time)   Results for orders placed during the hospital encounter of 08/18/12  COMPREHENSIVE METABOLIC PANEL      Result Value Range   Sodium 135  135 - 145 mEq/L   Potassium 4.1  3.5 - 5.1 mEq/L   Chloride 98  96 - 112 mEq/L   CO2 25  19 - 32 mEq/L   Glucose, Bld 196 (*) 70 - 99 mg/dL   BUN 18  6 - 23 mg/dL   Creatinine, Ser 4.33  0.50 - 1.35 mg/dL   Calcium 9.3  8.4 - 29.5 mg/dL   Total Protein 7.7  6.0 - 8.3 g/dL   Albumin 4.0  3.5 - 5.2 g/dL   AST 22  0 - 37 U/L   ALT 21  0 - 53 U/L   Alkaline Phosphatase 48  39 - 117 U/L   Total Bilirubin 0.3  0.3 - 1.2 mg/dL   GFR calc non Af Amer 90 (*) >90 mL/min   GFR calc Af Amer >90  >90 mL/min  CBC      Result Value Range   WBC 8.1  4.0 - 10.5 K/uL   RBC 5.31  4.22 - 5.81 MIL/uL   Hemoglobin 12.9 (*) 13.0 - 17.0 g/dL   HCT 18.8 (*) 41.6 - 60.6 %   MCV 71.6 (*) 78.0 - 100.0 fL   MCH 24.3 (*) 26.0 - 34.0 pg   MCHC 33.9  30.0 - 36.0 g/dL   RDW 30.1 (*) 60.1 - 09.3 %   Platelets 345  150 - 400 K/uL  TROPONIN I      Result Value Range   Troponin I <0.30  <0.30 ng/mL  TROPONIN I      Result Value Range   Troponin I <0.30  <0.30 ng/mL   Dg Chest 2 View  08/18/2012  *RADIOLOGY REPORT*  Clinical Data: Chest pain  CHEST - 2 VIEW  Comparison: April 16, 2012  Findings: The lungs are clear.  The heart size and pulmonary vascularity are normal.  No  adenopathy.  No pneumothorax.  There is slight thoracolumbar dextroscoliosis.  There are no focal bone lesions.  IMPRESSION: No edema or consolidation.   Original Report Authenticated By: Bretta Bang, M.D.       MDM  Iv ns. Labs. Cxr.  Reviewed nursing notes and prior charts for additional history.     Date: 08/18/2012  Rate: 75  Rhythm: normal sinus rhythm  QRS Axis: normal  Intervals: normal  ST/T Wave abnormalities: normal  Conduction Disutrbances:none  Narrative Interpretation:   Old EKG Reviewed: unchanged  Recheck pt comfortable. No distress. Denies pain or sob.  After constant symptoms since yesterday am, initial trop and repeat trop 4 yrs later normal.   Given hx, discussed need close f/u with his cardiologist.  Also discussed follow up re elev glucose 196.           Suzi Roots, MD 08/18/12 [id number]

## 2012-08-18 NOTE — ED Notes (Addendum)
Pt c/o left sided chest pain and neck "pulling" intermittent x "weeks". Pt sts he had an MI last August and this pain is different. Pt sts he has been followed by GI for heartburn issues and recently had esophagus "stretched". Pt also c/o "headaches" for days.

## 2012-08-19 ENCOUNTER — Telehealth: Payer: Self-pay | Admitting: Cardiovascular Disease

## 2012-08-19 NOTE — Telephone Encounter (Signed)
[facility] chest pain, told to see nishan in two days, his schedule is full, PA's  are also full

## 2012-08-19 NOTE — Telephone Encounter (Signed)
SPOKE WITH PT  C/O CHEST PAIN OFF AND ON ALL DAY YESTERDAY PAIN IS DIFFERENT THAN WHEN HAD STENTS DONE C/O OF JAW PAIN  WITH  THOSE EPISODES  WENT  TO [facility]  TO  BE EVAL  WAS TOLD TO FOLLOW UP  WITH   DR Eden Emms IN 2 DAYS   ,PT AWARE NO APPT AVAILABLE,  APPT MADE WITH LORI GERHARDT  NP FOR 3-17- AT 11:45 INFORMED PT MAY TRY AND TAKE NTG  IF HAS ANY OTHER EPISODES AND IF AFTER  3 NTG IN 15 MIN PERIOD   NO RELIEF THEN NEEDS TO CALL 911 PT VERBALIZED UNDERSTANDING .Alan Copeland

## 2012-08-25 ENCOUNTER — Ambulatory Visit: Payer: 59 | Admitting: Nurse Practitioner

## 2013-03-11 DIAGNOSIS — R7309 Other abnormal glucose: Secondary | ICD-10-CM | POA: Insufficient documentation

## 2013-03-17 ENCOUNTER — Other Ambulatory Visit: Payer: Self-pay | Admitting: Cardiovascular Disease

## 2013-04-14 ENCOUNTER — Other Ambulatory Visit: Payer: Self-pay | Admitting: Cardiovascular Disease

## 2013-05-14 DIAGNOSIS — I10 Essential (primary) hypertension: Secondary | ICD-10-CM | POA: Insufficient documentation

## 2013-05-14 DIAGNOSIS — E782 Mixed hyperlipidemia: Secondary | ICD-10-CM | POA: Insufficient documentation

## 2013-05-14 DIAGNOSIS — I2510 Atherosclerotic heart disease of native coronary artery without angina pectoris: Secondary | ICD-10-CM | POA: Insufficient documentation

## 2013-05-14 DIAGNOSIS — J019 Acute sinusitis, unspecified: Secondary | ICD-10-CM | POA: Insufficient documentation

## 2013-07-07 ENCOUNTER — Encounter: Payer: Self-pay | Admitting: Cardiovascular Disease

## 2013-07-15 ENCOUNTER — Other Ambulatory Visit: Payer: Self-pay | Admitting: *Deleted

## 2013-07-15 ENCOUNTER — Encounter: Payer: Self-pay | Admitting: *Deleted

## 2013-07-15 DIAGNOSIS — Z01818 Encounter for other preprocedural examination: Secondary | ICD-10-CM

## 2013-07-20 ENCOUNTER — Ambulatory Visit (INDEPENDENT_AMBULATORY_CARE_PROVIDER_SITE_OTHER): Payer: 59 | Admitting: Physician Assistant

## 2013-07-20 ENCOUNTER — Encounter: Payer: Self-pay | Admitting: Physician Assistant

## 2013-07-20 VITALS — BP 148/88 | HR 80 | Ht 74.0 in | Wt 212.7 lb

## 2013-07-20 DIAGNOSIS — I10 Essential (primary) hypertension: Secondary | ICD-10-CM

## 2013-07-20 DIAGNOSIS — R079 Chest pain, unspecified: Secondary | ICD-10-CM

## 2013-07-20 DIAGNOSIS — D689 Coagulation defect, unspecified: Secondary | ICD-10-CM

## 2013-07-20 DIAGNOSIS — F17200 Nicotine dependence, unspecified, uncomplicated: Secondary | ICD-10-CM

## 2013-07-20 DIAGNOSIS — Z01818 Encounter for other preprocedural examination: Secondary | ICD-10-CM

## 2013-07-20 DIAGNOSIS — Z720 Tobacco use: Secondary | ICD-10-CM

## 2013-07-20 NOTE — Assessment & Plan Note (Signed)
Patient is scheduled for a coronary angiography this coming Friday. He will not take metformin the day of the procedure.

## 2013-07-20 NOTE — Assessment & Plan Note (Signed)
Blood pressure controlled at this time °

## 2013-07-20 NOTE — Progress Notes (Signed)
Date:  07/20/2013   ID:  Alan Copeland, DOB April 25, 1966, MRN [id number]  PCP:  No primary provider on file.  Primary Cardiologist:  Hanley Hays     History of Present Illness: Alan Copeland is a 48 y.o. male history of coronary artery disease with NSTEMI in 2013 with PTCA and drug-eluting stent to the mid RCA, back abuse (quit one month ago), hypertension, diabetes. Patient presents for precath evaluation. He reports chest pain which may be associated with left arm pain ever since his previous MI, however, he reports it has been getting progressively worse. The episodes are very short in length, less than a minute, and he was recently started on Imdur which has not made a difference.  He does report shortness of breath.  The patient currently denies nausea, vomiting, fever, orthopnea, dizziness, PND, cough, congestion, abdominal pain, hematochezia, melena, lower extremity edema, claudication.  Wt Readings from Last 3 Encounters:  07/20/13 212 lb 11.2 oz (96.48 kg)  08/18/12 166 lb (75.297 kg)  06/18/12 166 lb (75.297 kg)     Past Medical History  Diagnosis Date  . Hypertension   . Anginal pain   . NSTEMI (non-ST elevated myocardial infarction) 01/25/2012    "light," mid RCA ptca/des 2013 with NSTEMI  . Headache(784.0)     "letting me know when my blood pressure's up"  . Diabetes   . Carotid bruit     Current Outpatient Prescriptions  Medication Sig Dispense Refill  . aspirin 81 MG tablet Take 81 mg by mouth daily.      Marland Kitchen atorvastatin (LIPITOR) 20 MG tablet Take 20 mg by mouth daily.      . clopidogrel (PLAVIX) 75 MG tablet TAKE 1 TABLET(S) BY MOUTH DAILY  30 tablet  0  . isosorbide mononitrate (IMDUR) 30 MG 24 hr tablet Take 30 mg by mouth daily.      Marland Kitchen lisinopril-hydrochlorothiazide (PRINZIDE,ZESTORETIC) 20-12.5 MG per tablet Take 1 tablet by mouth daily.       . metFORMIN (GLUCOPHAGE) 500 MG tablet Take 500 mg by mouth daily with breakfast.      . metoprolol tartrate  (LOPRESSOR) 25 MG tablet Take 1 tablet (25 mg total) by mouth daily.  30 tablet  11  . nitroGLYCERIN (NITROSTAT) 0.4 MG SL tablet Place 1 tablet (0.4 mg total) under the tongue every 5 (five) minutes x 3 doses as needed for chest pain.  30 tablet  6  . pantoprazole (PROTONIX) 20 MG tablet Take 20 mg by mouth daily.       No current facility-administered medications for this visit.    Allergies:   No Known Allergies  Social History:  The patient  reports that he quit smoking about 5 weeks ago. His smoking use included Cigarettes. He has a 14.5 pack-year smoking history. He has never used smokeless tobacco. He reports that he drinks about 2.4 ounces of alcohol per week. He reports that he uses illicit drugs (Marijuana).   Family history:   Family History  Problem Relation Age of Onset  . Hypertension Mother   . Hypertension Father   . Hypertension Brother   . Diabetes Mother   . Diabetes Father     ROS:  Please see the history of present illness.  All other systems reviewed and negative.   PHYSICAL EXAM: VS:  BP 148/88  Pulse 80  Ht 6\' 2"  (1.88 m)  Wt 212 lb 11.2 oz (96.48 kg)  BMI 27.30 kg/m2 overweight, well developed, in no  acute distress HEENT: Pupils are equal round react to light accommodation extraocular movements are intact.  Neck: no JVDNo cervical lymphadenopathy. Cardiac: Regular rate and rhythm without murmurs rubs or gallops. Lungs:  clear to auscultation bilaterally, no wheezing, rhonchi or rales Abd: soft, nontender, positive bowel sounds all quadrants, no hepatosplenomegaly Ext: no lower extremity edema.  2+ radial and dorsalis pedis pulses. Skin: warm and dry Neuro:  Grossly normal  EKG:  Normal sinus rhythm rate 80 beats per minute     ASSESSMENT AND PLAN:  Problem List Items Addressed This Visit   Hypertension     Blood pressure controlled at this time.    Tobacco use     Quit one month ago    Chest pain - Primary     Patient is scheduled for a  coronary angiography this coming Friday. He will not take metformin the day of the procedure.    Relevant Orders      EKG 12-Lead    Other Visit Diagnoses   Preoperative examination, unspecified        Relevant Orders       EKG 12-Lead       CBC       Basic metabolic panel    Other and unspecified coagulation defects        Relevant Orders       APTT       Protime-INR

## 2013-07-20 NOTE — Patient Instructions (Signed)
1.  do not take metformin the day of the procedure.  He can take all other medications and make sure you take aspirin and Plavix.

## 2013-07-20 NOTE — Assessment & Plan Note (Signed)
Quit one month ago

## 2013-07-21 ENCOUNTER — Encounter (HOSPITAL_COMMUNITY): Payer: Self-pay | Admitting: Pharmacy Technician

## 2013-07-21 LAB — PROTIME-INR
INR: 0.92 (ref <1.50)
Prothrombin Time: 12.3 seconds (ref 11.6–15.2)

## 2013-07-21 LAB — CBC
HCT: 38.9 % — ABNORMAL LOW (ref 39.0–52.0)
Hemoglobin: 12.9 g/dL — ABNORMAL LOW (ref 13.0–17.0)
MCH: 21.7 pg — AB (ref 26.0–34.0)
MCHC: 33.2 g/dL (ref 30.0–36.0)
MCV: 65.4 fL — AB (ref 78.0–100.0)
PLATELETS: 375 10*3/uL (ref 150–400)
RBC: 5.95 MIL/uL — ABNORMAL HIGH (ref 4.22–5.81)
RDW: 17.2 % — AB (ref 11.5–15.5)
WBC: 8.6 10*3/uL (ref 4.0–10.5)

## 2013-07-21 LAB — BASIC METABOLIC PANEL
BUN: 13 mg/dL (ref 6–23)
CALCIUM: 9.1 mg/dL (ref 8.4–10.5)
CHLORIDE: 102 meq/L (ref 96–112)
CO2: 25 mEq/L (ref 19–32)
CREATININE: 0.95 mg/dL (ref 0.50–1.35)
Glucose, Bld: 99 mg/dL (ref 70–99)
Potassium: 4.2 mEq/L (ref 3.5–5.3)
Sodium: 138 mEq/L (ref 135–145)

## 2013-07-21 LAB — APTT: APTT: 29 s (ref 24–37)

## 2013-07-24 ENCOUNTER — Encounter (HOSPITAL_COMMUNITY)
Admission: RE | Disposition: A | Discharge status: Home / Self Care | Payer: 59 | Source: Ambulatory Visit | Attending: Cardiovascular Disease

## 2013-07-24 ENCOUNTER — Inpatient Hospital Stay (HOSPITAL_COMMUNITY)
Admission: RE | Admit: 2013-07-24 | Discharge: 2013-07-28 | DRG: 247 | Disposition: A | Discharge status: Home / Self Care | Payer: 59 | Source: Ambulatory Visit | Attending: Cardiovascular Disease | Admitting: Cardiovascular Disease

## 2013-07-24 ENCOUNTER — Encounter (HOSPITAL_COMMUNITY): Payer: Self-pay | Admitting: General Practice

## 2013-07-24 DIAGNOSIS — I2510 Atherosclerotic heart disease of native coronary artery without angina pectoris: Secondary | ICD-10-CM

## 2013-07-24 DIAGNOSIS — F419 Anxiety disorder, unspecified: Secondary | ICD-10-CM

## 2013-07-24 DIAGNOSIS — Z79899 Other long term (current) drug therapy: Secondary | ICD-10-CM

## 2013-07-24 DIAGNOSIS — Z87891 Personal history of nicotine dependence: Secondary | ICD-10-CM

## 2013-07-24 DIAGNOSIS — Z7902 Long term (current) use of antithrombotics/antiplatelets: Secondary | ICD-10-CM

## 2013-07-24 DIAGNOSIS — Y849 Medical procedure, unspecified as the cause of abnormal reaction of the patient, or of later complication, without mention of misadventure at the time of the procedure: Secondary | ICD-10-CM | POA: Diagnosis present

## 2013-07-24 DIAGNOSIS — R079 Chest pain, unspecified: Secondary | ICD-10-CM

## 2013-07-24 DIAGNOSIS — I10 Essential (primary) hypertension: Secondary | ICD-10-CM

## 2013-07-24 DIAGNOSIS — Z720 Tobacco use: Secondary | ICD-10-CM

## 2013-07-24 DIAGNOSIS — I209 Angina pectoris, unspecified: Secondary | ICD-10-CM | POA: Diagnosis present

## 2013-07-24 DIAGNOSIS — Z833 Family history of diabetes mellitus: Secondary | ICD-10-CM

## 2013-07-24 DIAGNOSIS — I200 Unstable angina: Secondary | ICD-10-CM

## 2013-07-24 DIAGNOSIS — Z7982 Long term (current) use of aspirin: Secondary | ICD-10-CM

## 2013-07-24 DIAGNOSIS — I252 Old myocardial infarction: Secondary | ICD-10-CM

## 2013-07-24 DIAGNOSIS — E119 Type 2 diabetes mellitus without complications: Secondary | ICD-10-CM | POA: Diagnosis present

## 2013-07-24 DIAGNOSIS — F1210 Cannabis abuse, uncomplicated: Secondary | ICD-10-CM | POA: Diagnosis present

## 2013-07-24 DIAGNOSIS — Z01818 Encounter for other preprocedural examination: Secondary | ICD-10-CM

## 2013-07-24 DIAGNOSIS — T82897A Other specified complication of cardiac prosthetic devices, implants and grafts, initial encounter: Principal | ICD-10-CM | POA: Diagnosis present

## 2013-07-24 DIAGNOSIS — Z8249 Family history of ischemic heart disease and other diseases of the circulatory system: Secondary | ICD-10-CM

## 2013-07-24 HISTORY — DX: Sciatica, unspecified side: M54.30

## 2013-07-24 HISTORY — PX: LEFT HEART CATHETERIZATION WITH CORONARY ANGIOGRAM: SHX5451

## 2013-07-24 HISTORY — DX: Headache, unspecified: R51.9

## 2013-07-24 HISTORY — DX: Pure hypercholesterolemia, unspecified: E78.00

## 2013-07-24 HISTORY — DX: Headache: R51

## 2013-07-24 HISTORY — PX: CARDIAC CATHETERIZATION: SHX172

## 2013-07-24 HISTORY — DX: Type 2 diabetes mellitus without complications: E11.9

## 2013-07-24 LAB — GLUCOSE, CAPILLARY
GLUCOSE-CAPILLARY: 123 mg/dL — AB (ref 70–99)
Glucose-Capillary: 104 mg/dL — ABNORMAL HIGH (ref 70–99)
Glucose-Capillary: 97 mg/dL (ref 70–99)
Glucose-Capillary: 100 mg/dL — ABNORMAL HIGH (ref 70–99)
Glucose-Capillary: 94 mg/dL (ref 70–99)

## 2013-07-24 LAB — POCT ACTIVATED CLOTTING TIME: Activated Clotting Time: 481 seconds

## 2013-07-24 SURGERY — LEFT HEART CATHETERIZATION WITH CORONARY ANGIOGRAM
Anesthesia: LOCAL

## 2013-07-24 MED ORDER — CLOPIDOGREL BISULFATE 75 MG PO TABS
75.0000 mg | ORAL_TABLET | Freq: Every day | ORAL | Status: DC
  Administered 2013-07-25 – 2013-07-28 (×3): 75 mg via ORAL
  Filled 2013-07-24 (×2): qty 1

## 2013-07-24 MED ORDER — NITROGLYCERIN 0.2 MG/ML ON CALL CATH LAB
INTRAVENOUS | Status: AC
  Filled 2013-07-24: qty 1

## 2013-07-24 MED ORDER — BIVALIRUDIN 250 MG IV SOLR
INTRAVENOUS | Status: AC
  Filled 2013-07-24: qty 250

## 2013-07-24 MED ORDER — VERAPAMIL HCL 2.5 MG/ML IV SOLN
INTRAVENOUS | Status: AC
  Filled 2013-07-24: qty 2

## 2013-07-24 MED ORDER — CLOPIDOGREL BISULFATE 75 MG PO TABS: 75.0000 mg | ORAL_TABLET | Freq: Every day | ORAL | Status: DC

## 2013-07-24 MED ORDER — MORPHINE SULFATE 2 MG/ML IJ SOLN: 2.0000 mg | INTRAMUSCULAR | Status: DC | PRN

## 2013-07-24 MED ORDER — HEPARIN (PORCINE) IN NACL 2-0.9 UNIT/ML-% IJ SOLN
INTRAMUSCULAR | Status: AC
  Filled 2013-07-24: qty 500

## 2013-07-24 MED ORDER — LIDOCAINE HCL (PF) 1 % IJ SOLN
INTRAMUSCULAR | Status: AC
  Filled 2013-07-24: qty 30

## 2013-07-24 MED ORDER — ASPIRIN 81 MG PO CHEW: 81.0000 mg | CHEWABLE_TABLET | ORAL | Status: DC

## 2013-07-24 MED ORDER — ISOSORBIDE MONONITRATE ER 30 MG PO TB24
30.0000 mg | ORAL_TABLET | Freq: Every day | ORAL | Status: DC
  Administered 2013-07-24 – 2013-07-28 (×5): 30 mg via ORAL
  Filled 2013-07-24 (×6): qty 1

## 2013-07-24 MED ORDER — FENTANYL CITRATE 0.05 MG/ML IJ SOLN
INTRAMUSCULAR | Status: AC
  Filled 2013-07-24: qty 2

## 2013-07-24 MED ORDER — PANTOPRAZOLE SODIUM 20 MG PO TBEC
20.0000 mg | DELAYED_RELEASE_TABLET | Freq: Every day | ORAL | Status: DC
  Administered 2013-07-24 – 2013-07-28 (×4): 20 mg via ORAL
  Filled 2013-07-24 (×5): qty 1

## 2013-07-24 MED ORDER — HYDROCHLOROTHIAZIDE 12.5 MG PO CAPS
12.5000 mg | ORAL_CAPSULE | Freq: Every day | ORAL | Status: DC
  Administered 2013-07-24 – 2013-07-28 (×5): 12.5 mg via ORAL
  Filled 2013-07-24 (×6): qty 1

## 2013-07-24 MED ORDER — LIVING WELL WITH DIABETES BOOK
Freq: Once | Status: AC
  Administered 2013-07-24: 16:00:00
  Filled 2013-07-24: qty 1

## 2013-07-24 MED ORDER — SODIUM CHLORIDE 0.9 % IV SOLN: INTRAVENOUS | Status: AC

## 2013-07-24 MED ORDER — METOPROLOL TARTRATE 25 MG PO TABS
25.0000 mg | ORAL_TABLET | Freq: Every day | ORAL | Status: DC
  Administered 2013-07-24 – 2013-07-28 (×5): 25 mg via ORAL
  Filled 2013-07-24 (×6): qty 1

## 2013-07-24 MED ORDER — ASPIRIN 81 MG PO TABS: 81.0000 mg | ORAL_TABLET | Freq: Every day | ORAL | Status: DC

## 2013-07-24 MED ORDER — HEPARIN (PORCINE) IN NACL 2-0.9 UNIT/ML-% IJ SOLN
INTRAMUSCULAR | Status: AC
  Filled 2013-07-24: qty 1000

## 2013-07-24 MED ORDER — ASPIRIN 81 MG PO CHEW
CHEWABLE_TABLET | ORAL | Status: AC
  Administered 2013-07-24: 81 mg
  Filled 2013-07-24: qty 1

## 2013-07-24 MED ORDER — SODIUM CHLORIDE 0.9 % IJ SOLN: 3.0000 mL | INTRAMUSCULAR | Status: DC | PRN

## 2013-07-24 MED ORDER — LISINOPRIL-HYDROCHLOROTHIAZIDE 20-12.5 MG PO TABS: 1.0000 | ORAL_TABLET | Freq: Every day | ORAL | Status: DC

## 2013-07-24 MED ORDER — SODIUM CHLORIDE 0.9 % IV SOLN
INTRAVENOUS | Status: DC
  Administered 2013-07-24: 08:00:00 via INTRAVENOUS

## 2013-07-24 MED ORDER — MIDAZOLAM HCL 2 MG/2ML IJ SOLN
INTRAMUSCULAR | Status: AC
Start: 2013-07-24 — End: 2013-07-24
  Filled 2013-07-24: qty 2

## 2013-07-24 MED ORDER — NITROGLYCERIN 0.4 MG SL SUBL: 0.4000 mg | SUBLINGUAL_TABLET | SUBLINGUAL | Status: DC | PRN

## 2013-07-24 MED ORDER — LISINOPRIL 20 MG PO TABS
20.0000 mg | ORAL_TABLET | Freq: Every day | ORAL | Status: DC
  Administered 2013-07-24 – 2013-07-28 (×5): 20 mg via ORAL
  Filled 2013-07-24 (×6): qty 1

## 2013-07-24 MED ORDER — MIDAZOLAM HCL 2 MG/2ML IJ SOLN
INTRAMUSCULAR | Status: AC
  Filled 2013-07-24: qty 2

## 2013-07-24 MED ORDER — DIAZEPAM 5 MG PO TABS
5.0000 mg | ORAL_TABLET | ORAL | Status: AC
  Administered 2013-07-24: 5 mg via ORAL
  Filled 2013-07-24: qty 1

## 2013-07-24 MED ORDER — ASPIRIN 81 MG PO CHEW
81.0000 mg | CHEWABLE_TABLET | Freq: Every day | ORAL | Status: DC
  Administered 2013-07-25 – 2013-07-28 (×3): 81 mg via ORAL
  Filled 2013-07-24 (×3): qty 1

## 2013-07-24 MED ORDER — ATORVASTATIN CALCIUM 20 MG PO TABS
20.0000 mg | ORAL_TABLET | Freq: Every day | ORAL | Status: DC
  Administered 2013-07-24 – 2013-07-27 (×4): 20 mg via ORAL
  Filled 2013-07-24 (×6): qty 1

## 2013-07-24 NOTE — Interval H&P Note (Signed)
Cath Lab Visit (complete for each Cath Lab visit)  Clinical Evaluation Leading to the Procedure:   ACS: no  Non-ACS:    Anginal Classification: CCS III  Anti-ischemic medical therapy: Maximal Therapy (2 or more classes of medications)  Non-Invasive Test Results: No non-invasive testing performed  Prior CABG: No previous CABG      History and Physical Interval Note:  07/24/2013 9:40 AM  Patriciaann ClanJames E Cappella  has presented today for surgery, with the diagnosis of cp/cad  The various methods of treatment have been discussed with the patient and family. After consideration of risks, benefits and other options for treatment, the patient has consented to  Procedure(s): LEFT HEART CATHETERIZATION WITH CORONARY ANGIOGRAM (N/A) as a surgical intervention .  The patient's history has been reviewed, patient examined, no change in status, stable for surgery.  I have reviewed the patient's chart and labs.  Questions were answered to the patient's satisfaction.     Runell GessBERRY,Luara Faye J

## 2013-07-24 NOTE — H&P (View-Only) (Signed)
Date:  07/20/2013   ID:  Alan Copeland, DOB April 25, 1966, MRN [id number]  PCP:  No primary provider on file.  Primary Cardiologist:  Hanley Hays     History of Present Illness: Alan Copeland is a 48 y.o. male history of coronary artery disease with NSTEMI in 2013 with PTCA and drug-eluting stent to the mid RCA, back abuse (quit one month ago), hypertension, diabetes. Patient presents for precath evaluation. He reports chest pain which may be associated with left arm pain ever since his previous MI, however, he reports it has been getting progressively worse. The episodes are very short in length, less than a minute, and he was recently started on Imdur which has not made a difference.  He does report shortness of breath.  The patient currently denies nausea, vomiting, fever, orthopnea, dizziness, PND, cough, congestion, abdominal pain, hematochezia, melena, lower extremity edema, claudication.  Wt Readings from Last 3 Encounters:  07/20/13 212 lb 11.2 oz (96.48 kg)  08/18/12 166 lb (75.297 kg)  06/18/12 166 lb (75.297 kg)     Past Medical History  Diagnosis Date  . Hypertension   . Anginal pain   . NSTEMI (non-ST elevated myocardial infarction) 01/25/2012    "light," mid RCA ptca/des 2013 with NSTEMI  . Headache(784.0)     "letting me know when my blood pressure's up"  . Diabetes   . Carotid bruit     Current Outpatient Prescriptions  Medication Sig Dispense Refill  . aspirin 81 MG tablet Take 81 mg by mouth daily.      Marland Kitchen atorvastatin (LIPITOR) 20 MG tablet Take 20 mg by mouth daily.      . clopidogrel (PLAVIX) 75 MG tablet TAKE 1 TABLET(S) BY MOUTH DAILY  30 tablet  0  . isosorbide mononitrate (IMDUR) 30 MG 24 hr tablet Take 30 mg by mouth daily.      Marland Kitchen lisinopril-hydrochlorothiazide (PRINZIDE,ZESTORETIC) 20-12.5 MG per tablet Take 1 tablet by mouth daily.       . metFORMIN (GLUCOPHAGE) 500 MG tablet Take 500 mg by mouth daily with breakfast.      . metoprolol tartrate  (LOPRESSOR) 25 MG tablet Take 1 tablet (25 mg total) by mouth daily.  30 tablet  11  . nitroGLYCERIN (NITROSTAT) 0.4 MG SL tablet Place 1 tablet (0.4 mg total) under the tongue every 5 (five) minutes x 3 doses as needed for chest pain.  30 tablet  6  . pantoprazole (PROTONIX) 20 MG tablet Take 20 mg by mouth daily.       No current facility-administered medications for this visit.    Allergies:   No Known Allergies  Social History:  The patient  reports that he quit smoking about 5 weeks ago. His smoking use included Cigarettes. He has a 14.5 pack-year smoking history. He has never used smokeless tobacco. He reports that he drinks about 2.4 ounces of alcohol per week. He reports that he uses illicit drugs (Marijuana).   Family history:   Family History  Problem Relation Age of Onset  . Hypertension Mother   . Hypertension Father   . Hypertension Brother   . Diabetes Mother   . Diabetes Father     ROS:  Please see the history of present illness.  All other systems reviewed and negative.   PHYSICAL EXAM: VS:  BP 148/88  Pulse 80  Ht 6\' 2"  (1.88 m)  Wt 212 lb 11.2 oz (96.48 kg)  BMI 27.30 kg/m2 overweight, well developed, in no  acute distress HEENT: Pupils are equal round react to light accommodation extraocular movements are intact.  Neck: no JVDNo cervical lymphadenopathy. Cardiac: Regular rate and rhythm without murmurs rubs or gallops. Lungs:  clear to auscultation bilaterally, no wheezing, rhonchi or rales Abd: soft, nontender, positive bowel sounds all quadrants, no hepatosplenomegaly Ext: no lower extremity edema.  2+ radial and dorsalis pedis pulses. Skin: warm and dry Neuro:  Grossly normal  EKG:  Normal sinus rhythm rate 80 beats per minute     ASSESSMENT AND PLAN:  Problem List Items Addressed This Visit   Hypertension     Blood pressure controlled at this time.    Tobacco use     Quit one month ago    Chest pain - Primary     Patient is scheduled for a  coronary angiography this coming Friday. He will not take metformin the day of the procedure.    Relevant Orders      EKG 12-Lead    Other Visit Diagnoses   Preoperative examination, unspecified        Relevant Orders       EKG 12-Lead       CBC       Basic metabolic panel    Other and unspecified coagulation defects        Relevant Orders       APTT       Protime-INR

## 2013-07-24 NOTE — Plan of Care (Signed)
Problem: Food- and Nutrition-Related Knowledge Deficit (NB-1.1) Goal: Nutrition education Formal process to instruct or train a patient/client in a skill or to impart knowledge to help patients/clients voluntarily manage or modify food choices and eating behavior to maintain or improve health. Outcome: Completed/Met Date Met:  07/24/13  RD consulted for nutrition education regarding diabetes.   Pt dx one year ago and reports limited education. Pt drinks regular kool-aid and soda. Seemed willing to make changes.     No results found for this basename: HGBA1C    RD provided "Carbohydrate Counting for People with Diabetes" handout from the Academy of [employer]etics. Discussed different food groups and their effects on blood sugar, emphasizing carbohydrate-containing foods. Provided list of carbohydrates and recommended serving sizes of common foods.  Discussed importance of controlled and consistent carbohydrate intake throughout the day. Provided examples of ways to balance meals/snacks and encouraged intake of high-fiber, whole grain complex carbohydrates. Teach back method used.  Expect fair compliance.   Current diet order is CHO Modified, patient is consuming approximately 100% of meals at this time. Labs and medications reviewed. No further nutrition interventions warranted at this time. RD contact information provided. If additional nutrition issues arise, please re-consult RD.  [place], [place], [place] Pager [phone] After Hours Pager

## 2013-07-24 NOTE — CV Procedure (Signed)
Alan Copeland is a 48 y.o. male    [id number] LOCATION:  FACILITY: [facility]  PHYSICIAN: Nanetta BattyJonathan Airelle Everding, M.D. Feb 04, 1966   DATE OF PROCEDURE:  07/24/2013  DATE OF DISCHARGE:     CARDIAC CATHETERIZATION     History obtained from chart review.Alan Copeland is a 48 y.o. male history of coronary artery disease with NSTEMI in 2013 with PTCA and drug-eluting stent to the mid RCA, back abuse (quit one month ago), hypertension, diabetes. Patient presents for precath evaluation. He reports chest pain which may be associated with left arm pain ever since his previous MI, however, he reports it has been getting progressively worse. The episodes are very short in length, less than a minute, and he was recently started on Imdur which has not made a difference. He does report shortness of breath.    PROCEDURE DESCRIPTION:   The patient was brought to the second floor [place] [facility] cath lab in the postabsorptive state. He was premedicated with Valium 5 mg by mouth, IV Versed and fentanyl. His right wrist was prepped and shaved in usual sterile fashion. Xylocaine 1% was used for local anesthesia. A 6 French sheath was inserted into the right radial artery using standard Seldinger technique. The patient received 5000 units  of heparin  intravenously.  5385 JamaicaFrench JR 4 diagnostic catheter and 6 [place] XB 3 dye used for selective coronary angiography. A pigtail catheter was used for left ventriculography. Visipaque was used for the entirety of the case. A retrograde aortic, left ventricular end pullback pressures were recorded.    HEMODYNAMICS:    AO SYSTOLIC/AO DIASTOLIC: 111/66   LV SYSTOLIC/LV DIASTOLIC: 124/6  ANGIOGRAPHIC RESULTS:   1. Left main; normal  2. LAD; normal 3. Left circumflex; normal.  4. Right coronary artery; 99% in stent restenosis embolotherapy placed mid RCA stent 5. Left ventriculography; RAO left ventriculogram was performed using  25 mL of Visipaque dye at 12  mL/second. The overall LVEF estimated  70 %  With/Without wall motion abnormalities  IMPRESSION:Alan Copeland has a 99% "in-stent restenosis and throat within the previously placed RCA stent performed August of 2013 by Dr. Katrinka BlazingSmith. We will proceed with attempt at re\re PTCA  Procedure description: Using a 6 [place] JR 4 guide catheter along with multiple wires including a Pro-water, Asahi soft, Choice PT and whisper wires were used in an attempt to cross the diseased segment. The patient received Angiomax bolus with an ACT of 481. A total of 185 cc of contrast was administered to the patient. Multiple times were made to navigate the first acute bend of the RCA however the wire kept going out a small branch despite multiple wires and wire tip shapes. Ultimately the procedure was abandoned because of difficulty and contrast. Plans will be to bring him back Monday for reattempt at RCA intervention.  Final impression: Unsuccessful attempt at re\re intervention on mid RCA "in-stent restenosis". Patient received 185 cc of contrast. The proximal bend was unable to be navigated with multiple wires. We'll reattempt on Monday. The sheath was removed and a TR band was placed on the right wrist chief patent hemostasis. The patient left the Cath Lab in stable condition. He'll be hydrated overnight. He is already on dual antiplatelet therapy.  Runell GessBERRY,Alan Kilburg J. MD, [facility] CenterFACC 07/24/2013 10:53 AM

## 2013-07-24 NOTE — Progress Notes (Signed)
TR BAND REMOVAL  LOCATION:    right radial  DEFLATED PER PROTOCOL:    yes  TIME BAND OFF / DRESSING APPLIED:    1345   SITE UPON ARRIVAL:    Level 0  SITE AFTER BAND REMOVAL:    Level 0  REVERSE ALLEN'S TEST:     positive  CIRCULATION SENSATION AND MOVEMENT:    Within Normal Limits   yes  COMMENTS:   Tolerated procedure well  

## 2013-07-25 DIAGNOSIS — I2510 Atherosclerotic heart disease of native coronary artery without angina pectoris: Secondary | ICD-10-CM

## 2013-07-25 DIAGNOSIS — F17200 Nicotine dependence, unspecified, uncomplicated: Secondary | ICD-10-CM

## 2013-07-25 DIAGNOSIS — R079 Chest pain, unspecified: Secondary | ICD-10-CM

## 2013-07-25 DIAGNOSIS — I10 Essential (primary) hypertension: Secondary | ICD-10-CM

## 2013-07-25 DIAGNOSIS — I200 Unstable angina: Secondary | ICD-10-CM

## 2013-07-25 LAB — BASIC METABOLIC PANEL
BUN: 14 mg/dL (ref 6–23)
CO2: 22 mEq/L (ref 19–32)
CREATININE: 1.08 mg/dL (ref 0.50–1.35)
Calcium: 8.8 mg/dL (ref 8.4–10.5)
Chloride: 102 mEq/L (ref 96–112)
GFR calc Af Amer: >90 mL/min (ref >90)
GFR, EST NON AFRICAN AMERICAN: 81 mL/min — AB (ref >90)
Glucose, Bld: 115 mg/dL — ABNORMAL HIGH (ref 70–99)
Potassium: 4.2 mEq/L (ref 3.7–5.3)
Sodium: 137 mEq/L (ref 137–147)

## 2013-07-25 LAB — CBC
HEMATOCRIT: 36.6 % — AB (ref 39.0–52.0)
Hemoglobin: 12.1 g/dL — ABNORMAL LOW (ref 13.0–17.0)
MCH: 22.3 pg — AB (ref 26.0–34.0)
MCHC: 33.1 g/dL (ref 30.0–36.0)
MCV: 67.4 fL — AB (ref 78.0–100.0)
Platelets: 311 10*3/uL (ref 150–400)
RBC: 5.43 MIL/uL (ref 4.22–5.81)
RDW: 16.5 % — ABNORMAL HIGH (ref 11.5–15.5)
WBC: 6.7 10*3/uL (ref 4.0–10.5)

## 2013-07-25 LAB — GLUCOSE, CAPILLARY
GLUCOSE-CAPILLARY: 104 mg/dL — AB (ref 70–99)
Glucose-Capillary: 100 mg/dL — ABNORMAL HIGH (ref 70–99)
Glucose-Capillary: 126 mg/dL — ABNORMAL HIGH (ref 70–99)

## 2013-07-25 NOTE — Progress Notes (Signed)
    Subjective: He had a little pain in his right shoulder which resolved.   Objective: Vital signs in last 24 hours: Temp:  [97.3 F (36.3 C)-98.5 F (36.9 C)] 98.1 F (36.7 C) (02/14 0450) Pulse Rate:  [75-95] 85 (02/14 1000) Resp:  [18-20] 18 (02/14 0450) BP: (115-148)/(69-87) 139/72 mmHg (02/14 1000) SpO2:  [97 %-100 %] 97 % (02/14 0450) Weight:  [205 lb 12.8 oz (93.35 kg)-205 lb 13.1 oz (93.36 kg)] 205 lb 13.1 oz (93.36 kg) (02/14 0450) Last BM Date: 07/23/13  Intake/Output from previous day: 02/13 0701 - 02/14 0700 In: -  Out: 825 [Urine:825] Intake/Output this shift: Total I/O In: 240 [P.O.:240] Out: -   Medications Current Facility-Administered Medications  Medication Dose Route Frequency Provider Last Rate Last Dose  . aspirin chewable tablet 81 mg  81 mg Oral Daily Runell GessJonathan J Berry, MD   81 mg at 07/25/13 1010  . atorvastatin (LIPITOR) tablet 20 mg  20 mg Oral q1800 Runell GessJonathan J Berry, MD   20 mg at 07/24/13 1600  . clopidogrel (PLAVIX) tablet 75 mg  75 mg Oral Q breakfast Runell GessJonathan J Berry, MD   75 mg at 07/25/13 0834  . lisinopril (PRINIVIL,ZESTRIL) tablet 20 mg  20 mg Oral Daily Runell GessJonathan J Berry, MD   20 mg at 07/25/13 1011   And  . hydrochlorothiazide (MICROZIDE) capsule 12.5 mg  12.5 mg Oral Daily Runell GessJonathan J Berry, MD   12.5 mg at 07/25/13 1011  . isosorbide mononitrate (IMDUR) 24 hr tablet 30 mg  30 mg Oral Daily Runell GessJonathan J Berry, MD   30 mg at 07/25/13 1011  . metoprolol tartrate (LOPRESSOR) tablet 25 mg  25 mg Oral Daily Runell GessJonathan J Berry, MD   25 mg at 07/25/13 1000  . morphine 2 MG/ML injection 2 mg  2 mg Intravenous Q1H PRN Runell GessJonathan J Berry, MD      . nitroGLYCERIN (NITROSTAT) SL tablet 0.4 mg  0.4 mg Sublingual Q5 min PRN Runell GessJonathan J Berry, MD      . pantoprazole (PROTONIX) EC tablet 20 mg  20 mg Oral Daily Runell GessJonathan J Berry, MD   20 mg at 07/25/13 1011    PE: General appearance: alert, cooperative and no distress Lungs: clear to auscultation  bilaterally Heart: regular rate and rhythm, S1, S2 normal, no murmur, click, rub or gallop Extremities: No LEE Pulses: 2+ and symmetric Skin: Warm and Dry Neurologic: Grossly normal  Lab Results:   Recent Labs  07/25/13 0420  WBC 6.7  HGB 12.1*  HCT 36.6*  PLT 311   BMET  Recent Labs  07/25/13 0420  NA 137  K 4.2  CL 102  CO2 22  GLUCOSE 115*  BUN 14  CREATININE 1.08  CALCIUM 8.8    Assessment/Plan   Active Problems:   Hypertension   Tobacco use   CAD (coronary artery disease)  Plan:  SP LHC revealing 99% in stent restenosis embolotherapy placed mid RCA stent.   There was an unsuccessful attempt at re\re intervention on mid RCA "in-stent restenosis".  Dr. Allyson SabalBerry will reattempt on Monday.   BP and HR stable.  ASA plavix, HCTZ, Imdur 30, lopressor 25 daily.       LOS: 1 day    Filippo Puls 07/25/2013 11:14 AM

## 2013-07-25 NOTE — Progress Notes (Signed)
The patient was seen and examined, and I agree with the assessment and plan as documented above. Pt denies chest pain, palpitations, and shortness of breath at present. Continue medical management until Monday, when Dr. Allyson SabalBerry plans to reattempt PCI of the RCA.

## 2013-07-26 DIAGNOSIS — F411 Generalized anxiety disorder: Secondary | ICD-10-CM

## 2013-07-26 LAB — GLUCOSE, CAPILLARY
GLUCOSE-CAPILLARY: 101 mg/dL — AB (ref 70–99)
Glucose-Capillary: 110 mg/dL — ABNORMAL HIGH (ref 70–99)
Glucose-Capillary: 99 mg/dL (ref 70–99)
Glucose-Capillary: 160 mg/dL — ABNORMAL HIGH (ref 70–99)

## 2013-07-26 MED ORDER — ASPIRIN 81 MG PO CHEW
81.0000 mg | CHEWABLE_TABLET | ORAL | Status: AC
  Administered 2013-07-27: 81 mg via ORAL
  Filled 2013-07-26: qty 1

## 2013-07-26 MED ORDER — SODIUM CHLORIDE 0.9 % IJ SOLN: 3.0000 mL | INTRAMUSCULAR | Status: DC | PRN

## 2013-07-26 MED ORDER — SODIUM CHLORIDE 0.9 % IJ SOLN
3.0000 mL | Freq: Two times a day (BID) | INTRAMUSCULAR | Status: DC
  Administered 2013-07-26: 3 mL via INTRAVENOUS

## 2013-07-26 MED ORDER — ALPRAZOLAM 0.5 MG PO TABS
0.5000 mg | ORAL_TABLET | Freq: Two times a day (BID) | ORAL | Status: DC | PRN
  Administered 2013-07-26 (×2): 0.5 mg via ORAL
  Filled 2013-07-26 (×2): qty 1

## 2013-07-26 MED ORDER — ACETAMINOPHEN 325 MG PO TABS
650.0000 mg | ORAL_TABLET | Freq: Four times a day (QID) | ORAL | Status: DC | PRN
  Administered 2013-07-26: 650 mg via ORAL
  Filled 2013-07-26: qty 2

## 2013-07-26 MED ORDER — SODIUM CHLORIDE 0.9 % IV SOLN: 250.0000 mL | INTRAVENOUS | Status: DC | PRN

## 2013-07-26 MED ORDER — SODIUM CHLORIDE 0.9 % IV SOLN
1.0000 mL/kg/h | INTRAVENOUS | Status: DC
  Administered 2013-07-26: 1 mL/kg/h via INTRAVENOUS

## 2013-07-26 NOTE — Progress Notes (Signed)
The patient was seen and examined, and I agree with the assessment and plan as documented above.  Pt denies chest pain, palpitations, and shortness of breath at present. He is anxious. Continue medical management until Monday, when Dr. Berry plans to reattempt PCI of the RCA.  

## 2013-07-26 NOTE — Progress Notes (Signed)
    Subjective: No CP.  He is nervous.  Objective: Vital signs in last 24 hours: Temp:  [97.9 F (36.6 C)-98.4 F (36.9 C)] 97.9 F (36.6 C) (02/15 0637) Pulse Rate:  [69-91] 69 (02/15 0637) Resp:  [16-18] 16 (02/15 0637) BP: (113-139)/(58-76) 113/65 mmHg (02/15 0637) SpO2:  [100 %] 100 % (02/15 0637) Last BM Date: 07/25/13  Intake/Output from previous day: 02/14 0701 - 02/15 0700 In: 840 [P.O.:840] Out: -  Intake/Output this shift: Total I/O In: 360 [P.O.:360] Out: -   Medications Current Facility-Administered Medications  Medication Dose Route Frequency Provider Last Rate Last Dose  . aspirin chewable tablet 81 mg  81 mg Oral Daily Jonathan J Berry, MD   81 mg at 07/25/13 1010  . atorvastatin (LIPITOR) tablet 20 mg  20 mg Oral q1800 Jonathan J Berry, MD   20 mg at 07/25/13 1811  . clopidogrel (PLAVIX) tablet 75 mg  75 mg Oral Q breakfast Jonathan J Berry, MD   75 mg at 07/26/13 0812  . lisinopril (PRINIVIL,ZESTRIL) tablet 20 mg  20 mg Oral Daily Jonathan J Berry, MD   20 mg at 07/25/13 1011   And  . hydrochlorothiazide (MICROZIDE) capsule 12.5 mg  12.5 mg Oral Daily Jonathan J Berry, MD   12.5 mg at 07/25/13 1011  . isosorbide mononitrate (IMDUR) 24 hr tablet 30 mg  30 mg Oral Daily Jonathan J Berry, MD   30 mg at 07/25/13 1011  . metoprolol tartrate (LOPRESSOR) tablet 25 mg  25 mg Oral Daily Jonathan J Berry, MD   25 mg at 07/25/13 1000  . morphine 2 MG/ML injection 2 mg  2 mg Intravenous Q1H PRN Jonathan J Berry, MD      . nitroGLYCERIN (NITROSTAT) SL tablet 0.4 mg  0.4 mg Sublingual Q5 min PRN Jonathan J Berry, MD      . pantoprazole (PROTONIX) EC tablet 20 mg  20 mg Oral Daily Jonathan J Berry, MD   20 mg at 07/25/13 1011    PE: General appearance: alert, cooperative and no distress  Lungs: clear to auscultation bilaterally  Heart: regular rate and rhythm, S1, S2 normal, no murmur, click, rub or gallop  Extremities: No LEE  Pulses: 2+ and symmetric  Skin: Warm  and Dry  Neurologic: Grossly normal   Lab Results:   Recent Labs  07/25/13 0420  WBC 6.7  HGB 12.1*  HCT 36.6*  PLT 311   BMET  Recent Labs  07/25/13 0420  NA 137  K 4.2  CL 102  CO2 22  GLUCOSE 115*  BUN 14  CREATININE 1.08  CALCIUM 8.8    Assessment/Plan  Active Problems:   Hypertension   Tobacco use   CAD (coronary artery disease)  Plan: SP LHC revealing 99% in stent restenosis embolotherapy placed mid RCA stent. There was an unsuccessful attempt at re\re intervention on mid RCA "in-stent restenosis". Dr. Berry will reattempt on Monday. BP and HR stable. ASA plavix, HCTZ, Imdur 30, lopressor 25 daily.    Will add a low dose PRN xanax as the patient is very nervous.      LOS: 2 days    Alan Copeland 07/26/2013 9:26 AM   

## 2013-07-26 NOTE — Progress Notes (Signed)
Pt reports pain in right shoulder.  Pt also c/o sore throat, itchy watery eyes and sinus drainage.  Dr. Mayford Knifeurner notified with orders for 650 tylenol po q 6 hrs prn.  Will continue to monitor.

## 2013-07-27 ENCOUNTER — Encounter (HOSPITAL_COMMUNITY)
Admission: RE | Disposition: A | Discharge status: Home / Self Care | Payer: 59 | Source: Ambulatory Visit | Attending: Cardiovascular Disease

## 2013-07-27 DIAGNOSIS — I2510 Atherosclerotic heart disease of native coronary artery without angina pectoris: Secondary | ICD-10-CM

## 2013-07-27 HISTORY — PX: PERCUTANEOUS CORONARY STENT INTERVENTION (PCI-S): SHX5485

## 2013-07-27 LAB — GLUCOSE, CAPILLARY
Glucose-Capillary: 99 mg/dL (ref 70–99)
Glucose-Capillary: 113 mg/dL — ABNORMAL HIGH (ref 70–99)
Glucose-Capillary: 104 mg/dL — ABNORMAL HIGH (ref 70–99)
Glucose-Capillary: 131 mg/dL — ABNORMAL HIGH (ref 70–99)

## 2013-07-27 LAB — POCT ACTIVATED CLOTTING TIME
Activated Clotting Time: 249 seconds
Activated Clotting Time: 321 seconds

## 2013-07-27 SURGERY — PERCUTANEOUS CORONARY STENT INTERVENTION (PCI-S)
Anesthesia: LOCAL

## 2013-07-27 MED ORDER — VERAPAMIL HCL 2.5 MG/ML IV SOLN
INTRAVENOUS | Status: AC
  Filled 2013-07-27: qty 2

## 2013-07-27 MED ORDER — MIDAZOLAM HCL 2 MG/2ML IJ SOLN
INTRAMUSCULAR | Status: AC
  Filled 2013-07-27: qty 2

## 2013-07-27 MED ORDER — SODIUM CHLORIDE 0.9 % IV SOLN: 250.0000 mL | INTRAVENOUS | Status: DC | PRN

## 2013-07-27 MED ORDER — LIDOCAINE HCL (PF) 1 % IJ SOLN
INTRAMUSCULAR | Status: AC
  Filled 2013-07-27: qty 30

## 2013-07-27 MED ORDER — SODIUM CHLORIDE 0.9 % IV SOLN: 1.0000 mL/kg/h | INTRAVENOUS | Status: AC

## 2013-07-27 MED ORDER — SODIUM CHLORIDE 0.9 % IJ SOLN: 3.0000 mL | Freq: Two times a day (BID) | INTRAMUSCULAR | Status: DC

## 2013-07-27 MED ORDER — NITROGLYCERIN IN D5W 200-5 MCG/ML-% IV SOLN
INTRAVENOUS | Status: AC
  Filled 2013-07-27: qty 250

## 2013-07-27 MED ORDER — NITROGLYCERIN 0.2 MG/ML ON CALL CATH LAB
INTRAVENOUS | Status: AC
  Filled 2013-07-27: qty 1

## 2013-07-27 MED ORDER — HEPARIN (PORCINE) IN NACL 2-0.9 UNIT/ML-% IJ SOLN
INTRAMUSCULAR | Status: AC
  Filled 2013-07-27: qty 1000

## 2013-07-27 MED ORDER — HEPARIN SODIUM (PORCINE) 1000 UNIT/ML IJ SOLN
INTRAMUSCULAR | Status: AC
  Filled 2013-07-27: qty 1

## 2013-07-27 MED ORDER — FENTANYL CITRATE 0.05 MG/ML IJ SOLN
INTRAMUSCULAR | Status: AC
  Filled 2013-07-27: qty 2

## 2013-07-27 MED ORDER — SODIUM CHLORIDE 0.9 % IJ SOLN: 3.0000 mL | INTRAMUSCULAR | Status: DC | PRN

## 2013-07-27 MED FILL — Sodium Chloride IV Soln 0.9%: INTRAVENOUS | Qty: 50 | Status: AC

## 2013-07-27 NOTE — H&P (View-Only) (Signed)
    Subjective: No CP.  He is nervous.  Objective: Vital signs in last 24 hours: Temp:  [97.9 F (36.6 C)-98.4 F (36.9 C)] 97.9 F (36.6 C) (02/15 0637) Pulse Rate:  [69-91] 69 (02/15 0637) Resp:  [16-18] 16 (02/15 0637) BP: (113-139)/(58-76) 113/65 mmHg (02/15 0637) SpO2:  [100 %] 100 % (02/15 0637) Last BM Date: 07/25/13  Intake/Output from previous day: 02/14 0701 - 02/15 0700 In: 840 [P.O.:840] Out: -  Intake/Output this shift: Total I/O In: 360 [P.O.:360] Out: -   Medications Current Facility-Administered Medications  Medication Dose Route Frequency Provider Last Rate Last Dose  . aspirin chewable tablet 81 mg  81 mg Oral Daily Runell GessJonathan J Berry, MD   81 mg at 07/25/13 1010  . atorvastatin (LIPITOR) tablet 20 mg  20 mg Oral q1800 Runell GessJonathan J Berry, MD   20 mg at 07/25/13 1811  . clopidogrel (PLAVIX) tablet 75 mg  75 mg Oral Q breakfast Runell GessJonathan J Berry, MD   75 mg at 07/26/13 [id number]  . lisinopril (PRINIVIL,ZESTRIL) tablet 20 mg  20 mg Oral Daily Runell GessJonathan J Berry, MD   20 mg at 07/25/13 1011   And  . hydrochlorothiazide (MICROZIDE) capsule 12.5 mg  12.5 mg Oral Daily Runell GessJonathan J Berry, MD   12.5 mg at 07/25/13 1011  . isosorbide mononitrate (IMDUR) 24 hr tablet 30 mg  30 mg Oral Daily Runell GessJonathan J Berry, MD   30 mg at 07/25/13 1011  . metoprolol tartrate (LOPRESSOR) tablet 25 mg  25 mg Oral Daily Runell GessJonathan J Berry, MD   25 mg at 07/25/13 1000  . morphine 2 MG/ML injection 2 mg  2 mg Intravenous Q1H PRN Runell GessJonathan J Berry, MD      . nitroGLYCERIN (NITROSTAT) SL tablet 0.4 mg  0.4 mg Sublingual Q5 min PRN Runell GessJonathan J Berry, MD      . pantoprazole (PROTONIX) EC tablet 20 mg  20 mg Oral Daily Runell GessJonathan J Berry, MD   20 mg at 07/25/13 1011    PE: General appearance: alert, cooperative and no distress  Lungs: clear to auscultation bilaterally  Heart: regular rate and rhythm, S1, S2 normal, no murmur, click, rub or gallop  Extremities: No LEE  Pulses: 2+ and symmetric  Skin: Warm  and Dry  Neurologic: Grossly normal   Lab Results:   Recent Labs  07/25/13 0420  WBC 6.7  HGB 12.1*  HCT 36.6*  PLT 311   BMET  Recent Labs  07/25/13 0420  NA 137  K 4.2  CL 102  CO2 22  GLUCOSE 115*  BUN 14  CREATININE 1.08  CALCIUM 8.8    Assessment/Plan  Active Problems:   Hypertension   Tobacco use   CAD (coronary artery disease)  Plan: SP LHC revealing 99% in stent restenosis embolotherapy placed mid RCA stent. There was an unsuccessful attempt at re\re intervention on mid RCA "in-stent restenosis". Dr. Allyson SabalBerry will reattempt on Monday. BP and HR stable. ASA plavix, HCTZ, Imdur 30, lopressor 25 daily.    Will add a low dose PRN xanax as the patient is very nervous.      LOS: 2 days    Alan Copeland 07/26/2013 9:26 AM

## 2013-07-27 NOTE — H&P (View-Only) (Signed)
The patient was seen and examined, and I agree with the assessment and plan as documented above.  Pt denies chest pain, palpitations, and shortness of breath at present. He is anxious. Continue medical management until Monday, when Dr. Allyson SabalBerry plans to reattempt PCI of the RCA.

## 2013-07-27 NOTE — Interval H&P Note (Signed)
History and Physical Interval Note:  07/27/2013 8:54 AM  Alan Copeland  has presented today for surgery, with the diagnosis of blockage  The various methods of treatment have been discussed with the patient and family. After consideration of risks, benefits and other options for treatment, the patient has consented to  Procedure(s): PERCUTANEOUS CORONARY STENT INTERVENTION (PCI-S) (N/A) as a surgical intervention .  The patient's history has been reviewed, patient examined, no change in status, stable for surgery.  I have reviewed the patient's chart and labs.  Questions were answered to the patient's satisfaction.    Cath Lab Visit (complete for each Cath Lab visit)  Clinical Evaluation Leading to the Procedure:   ACS: no  Non-ACS:    Anginal Classification: CCS III  Anti-ischemic medical therapy: Maximal Therapy (2 or more classes of medications)  Non-Invasive Test Results: No non-invasive testing performed  Prior CABG: No previous CABG       Tonny BollmanMichael Kahleb Mcclane

## 2013-07-27 NOTE — Progress Notes (Signed)
TR BAND REMOVAL  LOCATION:    right radial  DEFLATED PER PROTOCOL:    yes  TIME BAND OFF / DRESSING APPLIED:    1400   SITE UPON ARRIVAL:    Level 0  SITE AFTER BAND REMOVAL:    Level 0  REVERSE ALLEN'S TEST:     positive  CIRCULATION SENSATION AND MOVEMENT:    Within Normal Limits   yes  COMMENTS:   Gauze dressing applied, secured with tegaderm. 1430 rechecked site, without change, CSMs wnls, radial and ulnar pulses +2, dressing dry and intact.

## 2013-07-27 NOTE — CV Procedure (Signed)
    CARDIAC CATH NOTE  Name: Alan ClanJames E Harsha MRN: [id number] DOB: 1965-11-28  Procedure: PTCA and stenting of the mid-RCA  Indication: 48 year old gentleman with known CAD. He presented with progressive anginal symptoms. He underwent cardiac cath and attempted PCI of the right coronary artery last week. There was difficulty wiring the RCA despite a prolonged attempt and multiple wires. The patient has marked tortuosity of the proximal right coronary artery. He returns today for PCI of severe in-stent restenosis in the mid vessel. There is a 95-99% stenosis and has previously placed stent from 2013.  Procedural Details: The right wrist was prepped, draped, and anesthetized with 1% lidocaine. Using the modified Seldinger technique, a 5/6 Fr sheath was introduced into the radial artery. 3 mg verapamil was administered through the radial sheath. Weight-based heparin was given for anticoagulation. Once a therapeutic ACT was achieved, a 6 [place] AL-1 guide catheter was inserted.  I was unable to engage the RCA with this. This was changed out for a JR 4 guide. A cougar coronary guidewire was used to cross the lesion.  The lesion was predilated with a 2.5 mm balloon.  Following predilatation, there was no flow around the proximal vessel. I was concerned about the possibility of dissection or vasospasm. Intracoronary nitroglycerin was administered. The wire was pulled back into the proximal vessel. TIMI-3 flow returned but there was diffuse spasm seen in multiple areas of the RCA. Additional nitroglycerin was administered. At that point, the lesion was rewired and then the stented segment was dilated with a 3.0 mm cutting balloon on multiple inflations to 12 atmospheres. There was a good result through most of the stent, but there was residual stenosis at the proximal edge. There was mild stenosis at the distal edge. Considering his diabetes and aggressive restenosis, I felt the best long-term solution would be  a second stent. The previously implanted stent was a 2.5 x 16 mm Promus DES. I used a 3.0 x 24 mm Promus DES which was positioned so that there was stent overhang proximally and distally. The stent was deployed at 12 atmospheres. The stent was postdilated with a 3.25 mm noncompliant balloon to 16 atmospheres.  Following PCI, there was 0% residual stenosis and TIMI-3 flow. Final angiography confirmed an excellent result. The patient tolerated the procedure well. There were no immediate procedural complications. A TR band was used for radial hemostasis. The patient was transferred to the post catheterization recovery area for further monitoring.  Lesion Data: Vessel: RCA/Mid (in-stent restenosis) Percent stenosis (pre): 95 TIMI-flow (pre):  3 Stent:  3.0x24 mm Promus DES Percent stenosis (post): 0 TIMI-flow (post): 3  Conclusions: Successful PCI of severe in-stent restenosis in the mid-RCA, treated with a Promus DES  Recommendations: Continued DAPT with ASA and plavix at least another 12 months without interruption. Outpatient follow-up with Dr Hanley Haysosario.  Tonny BollmanMichael Raisha Brabender 07/27/2013, 10:11 AM

## 2013-07-28 LAB — BASIC METABOLIC PANEL
BUN: 11 mg/dL (ref 6–23)
CO2: 22 mEq/L (ref 19–32)
CREATININE: 0.98 mg/dL (ref 0.50–1.35)
Calcium: 9.4 mg/dL (ref 8.4–10.5)
Chloride: 102 mEq/L (ref 96–112)
GFR calc Af Amer: >90 mL/min (ref >90)
Glucose, Bld: 108 mg/dL — ABNORMAL HIGH (ref 70–99)
Potassium: 4.4 mEq/L (ref 3.7–5.3)
SODIUM: 138 meq/L (ref 137–147)

## 2013-07-28 LAB — CBC
HCT: 39.9 % (ref 39.0–52.0)
Hemoglobin: 13.2 g/dL (ref 13.0–17.0)
MCH: 22.3 pg — ABNORMAL LOW (ref 26.0–34.0)
MCHC: 33.1 g/dL (ref 30.0–36.0)
MCV: 67.5 fL — AB (ref 78.0–100.0)
Platelets: 372 10*3/uL (ref 150–400)
RBC: 5.91 MIL/uL — ABNORMAL HIGH (ref 4.22–5.81)
RDW: 16.7 % — AB (ref 11.5–15.5)
WBC: 7.8 10*3/uL (ref 4.0–10.5)

## 2013-07-28 LAB — GLUCOSE, CAPILLARY: Glucose-Capillary: 107 mg/dL — ABNORMAL HIGH (ref 70–99)

## 2013-07-28 MED ORDER — PANTOPRAZOLE SODIUM 20 MG PO TBEC: 20.0000 mg | DELAYED_RELEASE_TABLET | Freq: Every day | ORAL | Status: DC

## 2013-07-28 NOTE — Progress Notes (Signed)
CARDIAC REHAB PHASE I   PRE:  Rate/Rhythm: 83SR  BP:  Supine: 158/90  Sitting:   Standing:    SaO2:   MODE:  Ambulation: 800 ft   POST:  Rate/Rhythm: 73 SR  BP:  Supine:   Sitting: 175/76  Standing:    SaO2:  [id number] Pt walked 800 ft on RA with steady gait. No CP. Tolerated well. Education completed. Dicussed carb counting and gave diet. Went over breakfast tray with pt to show how carbs counted also. Discussed CRP 2 but pt declined at this time. Encouraged walking on his own to help diabetes and heart disease. Pt quit smoking on NEW YEAR'S. Congratulated pt. Left smoking cessation handouts in case needed.   Luetta Nuttingharlene Everet Flagg, RN BSN  07/28/2013 8:47 AM

## 2013-07-28 NOTE — Discharge Instructions (Signed)
Call [facility] at [phone] if any bleeding, swelling or drainage at cath site.  May shower, no tub baths for 48 hours for groin sticks.   No lifting over 8 pounds for 3 days, no driving for 3 days.    Hear Healthy diabetic diet  NO METFORMIN UNTIL 07/30/13, It may interact with cath dye.      Do not stop Plavix, stopping could cause a heart attack.  Congratulations on stopping tobacco.

## 2013-07-28 NOTE — Discharge Summary (Signed)
The patient was seen and examined. He will follow-[facility] with [facility]. He is on dual antiplatelet therapy and the importance of daily therapy reviewed.

## 2013-07-28 NOTE — Discharge Summary (Addendum)
Void this note.

## 2013-07-28 NOTE — Discharge Summary (Signed)
Physician Discharge Summary       Patient ID: Patriciaann ClanJames E Geidel MRN: [id number] DOB/AGE: 11-Feb-1966 48 y.o.  Admit date: 07/24/2013 Discharge date: 07/28/2013  Discharge Diagnoses:  Active Problems:   Hypertension   Tobacco use   CAD (coronary artery disease)   Discharged Condition: good  Procedures: 07/27/13 cardiac cath by Dr. Excell Seltzerooper.  07/27/13 Successful PCI of severe in-stent restenosis in the mid-RCA, treated with a Promus DES   Hospital Course: 48 y.o. male history of coronary artery disease with NSTEMI in 2013 with PTCA and drug-eluting stent to the mid RCA, tobacco abuse (quit one month ago), hypertension, diabetes.  He reports chest pain which may be associated with left arm pain ever since his previous MI, however, he reports it has been getting progressively worse. The episodes are very short in length, less than a minute, and he was recently started on Imdur which has not made a difference. He does report shortness of breath.  It was arranged for him to have elective cath for angina.    Cath revealed in stent restenosis and he underwent successful PCI of severe in-stent restenosis in the mid-RCA, treated with a Promus DES.  No complications.   By AM of discharge he was seen by Dr. Katrinka BlazingSmith and found to be stable and ready for discharge home.   He will follow up with Dr Hanley Haysosario.  He has stopped smoking and was congratulated.   .   Consults: None  Significant Diagnostic Studies:  BMET    Component Value Date/Time   NA 138 07/28/2013 0540   K 4.4 07/28/2013 0540   CL 102 07/28/2013 0540   CO2 22 07/28/2013 0540   GLUCOSE 108* 07/28/2013 0540   BUN 11 07/28/2013 0540   CREATININE 0.98 07/28/2013 0540   CREATININE 0.95 07/20/2013 1703   CALCIUM 9.4 07/28/2013 0540   GFRNONAA >90 07/28/2013 0540   GFRAA >90 07/28/2013 0540    CBC    Component Value Date/Time   WBC 7.8 07/28/2013 0540   RBC 5.91* 07/28/2013 0540   HGB 13.2 07/28/2013 0540   HCT 39.9 07/28/2013 0540   PLT 372  07/28/2013 0540   MCV 67.5* 07/28/2013 0540   MCH 22.3* 07/28/2013 0540   MCHC 33.1 07/28/2013 0540   RDW 16.7* 07/28/2013 0540   LYMPHSABS 3.7 04/16/2012 1000   MONOABS 0.9 04/16/2012 1000   EOSABS 0.5 04/16/2012 1000   BASOSABS 0.0 04/16/2012 1000       Discharge Exam: Blood pressure 158/90, pulse 93, temperature 97.8 F (36.6 C), temperature source Oral, resp. rate 20, height 6\' 2"  (1.88 m), weight 207 lb 7.3 oz (94.1 kg), SpO2 99.00%.    Disposition: 01-Home or Self Care     Medication List         aspirin 81 MG tablet  Take 81 mg by mouth daily.     atorvastatin 20 MG tablet  Commonly known as:  LIPITOR  Take 20 mg by mouth daily.     clopidogrel 75 MG tablet  Commonly known as:  PLAVIX  Take 75 mg by mouth daily with breakfast.     isosorbide mononitrate 30 MG 24 hr tablet  Commonly known as:  IMDUR  Take 30 mg by mouth daily.     lisinopril-hydrochlorothiazide 20-12.5 MG per tablet  Commonly known as:  PRINZIDE,ZESTORETIC  Take 1 tablet by mouth daily.     metFORMIN 500 MG tablet  Commonly known as:  GLUCOPHAGE  Take 500 mg by mouth daily  with breakfast.     metoprolol tartrate 25 MG tablet  Commonly known as:  LOPRESSOR  Take 25 mg by mouth daily.     nitroGLYCERIN 0.4 MG SL tablet  Commonly known as:  NITROSTAT  Place 0.4 mg under the tongue every 5 (five) minutes as needed for chest pain.     pantoprazole 20 MG tablet  Commonly known as:  PROTONIX  Take 20 mg by mouth daily.          Discharge Instructions: Call [facility] at [phone] if any bleeding, swelling or drainage at cath site.  May shower, no tub baths for 48 hours for groin sticks.   No lifting over 8 pounds for 3 days, no driving for 3 days.    Hear Healthy diabetic diet  NO METFORMIN UNTIL 07/30/13, It may interact with cath dye.      Do not stop Plavix, stopping could cause a heart attack.  Signed: Leone Brand Nurse Practitioner-Certified [facility]: [facility] 07/28/2013, 8:54 AM  Time spent on discharge :>30 minutes.

## 2013-07-28 NOTE — Discharge Summary (Signed)
He presented with angina and found to have ISR in RCA DES. Had successful re-intervention and is asymptomatic this AM. He is ready for discharge and will f/[facility] later this month. Has DAPT requirement for at least 1 year.

## 2014-02-02 ENCOUNTER — Emergency Department (HOSPITAL_BASED_OUTPATIENT_CLINIC_OR_DEPARTMENT_OTHER)
Admission: EM | Admit: 2014-02-02 | Discharge: 2014-02-02 | Disposition: A | Discharge status: Home / Self Care | Payer: 59 | Attending: Emergency Medicine | Admitting: Emergency Medicine

## 2014-02-02 ENCOUNTER — Encounter (HOSPITAL_BASED_OUTPATIENT_CLINIC_OR_DEPARTMENT_OTHER): Payer: Self-pay | Admitting: Emergency Medicine

## 2014-02-02 DIAGNOSIS — R12 Heartburn: Secondary | ICD-10-CM | POA: Insufficient documentation

## 2014-02-02 DIAGNOSIS — I252 Old myocardial infarction: Secondary | ICD-10-CM | POA: Insufficient documentation

## 2014-02-02 DIAGNOSIS — E7800 Pure hypercholesterolemia, unspecified: Secondary | ICD-10-CM | POA: Diagnosis not present

## 2014-02-02 DIAGNOSIS — K209 Esophagitis, unspecified without bleeding: Secondary | ICD-10-CM | POA: Diagnosis not present

## 2014-02-02 DIAGNOSIS — E119 Type 2 diabetes mellitus without complications: Secondary | ICD-10-CM | POA: Diagnosis not present

## 2014-02-02 DIAGNOSIS — Z7982 Long term (current) use of aspirin: Secondary | ICD-10-CM | POA: Insufficient documentation

## 2014-02-02 DIAGNOSIS — Z79899 Other long term (current) drug therapy: Secondary | ICD-10-CM | POA: Insufficient documentation

## 2014-02-02 DIAGNOSIS — K219 Gastro-esophageal reflux disease without esophagitis: Secondary | ICD-10-CM | POA: Insufficient documentation

## 2014-02-02 DIAGNOSIS — I10 Essential (primary) hypertension: Secondary | ICD-10-CM | POA: Insufficient documentation

## 2014-02-02 DIAGNOSIS — Z9889 Other specified postprocedural states: Secondary | ICD-10-CM | POA: Insufficient documentation

## 2014-02-02 DIAGNOSIS — R079 Chest pain, unspecified: Secondary | ICD-10-CM

## 2014-02-02 DIAGNOSIS — Z9861 Coronary angioplasty status: Secondary | ICD-10-CM | POA: Insufficient documentation

## 2014-02-02 DIAGNOSIS — Z87891 Personal history of nicotine dependence: Secondary | ICD-10-CM | POA: Insufficient documentation

## 2014-02-02 DIAGNOSIS — I209 Angina pectoris, unspecified: Secondary | ICD-10-CM | POA: Insufficient documentation

## 2014-02-02 DIAGNOSIS — Z7902 Long term (current) use of antithrombotics/antiplatelets: Secondary | ICD-10-CM | POA: Insufficient documentation

## 2014-02-02 DIAGNOSIS — K2100 Gastro-esophageal reflux disease with esophagitis, without bleeding: Secondary | ICD-10-CM

## 2014-02-02 LAB — CBC WITH DIFFERENTIAL/PLATELET
BASOS PCT: 1 % (ref 0–1)
Basophils Absolute: 0.1 10*3/uL (ref 0.0–0.1)
Eosinophils Absolute: 0.3 10*3/uL (ref 0.0–0.7)
Eosinophils Relative: 4 % (ref 0–5)
HEMATOCRIT: 39.3 % (ref 39.0–52.0)
HEMOGLOBIN: 13.4 g/dL (ref 13.0–17.0)
Lymphocytes Relative: 33 % (ref 12–46)
Lymphs Abs: 2.4 10*3/uL (ref 0.7–4.0)
MCH: 22.7 pg — AB (ref 26.0–34.0)
MCHC: 34.1 g/dL (ref 30.0–36.0)
MCV: 66.6 fL — ABNORMAL LOW (ref 78.0–100.0)
MONO ABS: 0.7 10*3/uL (ref 0.1–1.0)
Monocytes Relative: 10 % (ref 3–12)
NEUTROS ABS: 3.8 10*3/uL (ref 1.7–7.7)
Neutrophils Relative %: 52 % (ref 43–77)
Platelets: 364 10*3/uL (ref 150–400)
RBC: 5.9 MIL/uL — ABNORMAL HIGH (ref 4.22–5.81)
RDW: 18.7 % — ABNORMAL HIGH (ref 11.5–15.5)
WBC: 7.3 10*3/uL (ref 4.0–10.5)

## 2014-02-02 LAB — COMPREHENSIVE METABOLIC PANEL
ALT: 16 U/L (ref 0–53)
AST: 17 U/L (ref 0–37)
Albumin: 4.1 g/dL (ref 3.5–5.2)
Alkaline Phosphatase: 55 U/L (ref 39–117)
Anion gap: 15 (ref 5–15)
BUN: 14 mg/dL (ref 6–23)
CO2: 23 meq/L (ref 19–32)
CREATININE: 1.1 mg/dL (ref 0.50–1.35)
Calcium: 10.1 mg/dL (ref 8.4–10.5)
Chloride: 99 mEq/L (ref 96–112)
GFR calc non Af Amer: 79 mL/min — ABNORMAL LOW (ref >90)
GLUCOSE: 122 mg/dL — AB (ref 70–99)
Potassium: 3.9 mEq/L (ref 3.7–5.3)
Sodium: 137 mEq/L (ref 137–147)
Total Bilirubin: 0.4 mg/dL (ref 0.3–1.2)
Total Protein: 8.1 g/dL (ref 6.0–8.3)

## 2014-02-02 LAB — TROPONIN I: Troponin I: <0.3 ng/mL (ref <0.30)

## 2014-02-02 LAB — LIPASE, BLOOD: LIPASE: 41 U/L (ref 11–59)

## 2014-02-02 MED ORDER — PANTOPRAZOLE SODIUM 20 MG PO TBEC: 20.0000 mg | DELAYED_RELEASE_TABLET | Freq: Every day | ORAL | Status: DC

## 2014-02-02 MED ORDER — SUCRALFATE 1 G PO TABS: 1.0000 g | ORAL_TABLET | Freq: Four times a day (QID) | ORAL | Status: DC

## 2014-02-02 MED ORDER — GI COCKTAIL ~~LOC~~
30.0000 mL | Freq: Once | ORAL | Status: AC
  Administered 2014-02-02: 30 mL via ORAL
  Filled 2014-02-02: qty 30

## 2014-02-02 MED ORDER — PANTOPRAZOLE SODIUM 40 MG IV SOLR
40.0000 mg | Freq: Once | INTRAVENOUS | Status: AC
  Administered 2014-02-02: 40 mg via INTRAVENOUS
  Filled 2014-02-02: qty 40

## 2014-02-02 MED ORDER — CETIRIZINE HCL 10 MG PO CAPS: 1.0000 | ORAL_CAPSULE | Freq: Every day | ORAL | Status: DC

## 2014-02-02 MED ORDER — METOCLOPRAMIDE HCL 5 MG/ML IJ SOLN
10.0000 mg | Freq: Once | INTRAMUSCULAR | Status: AC
  Administered 2014-02-02: 10 mg via INTRAVENOUS
  Filled 2014-02-02: qty 2

## 2014-02-02 NOTE — ED Notes (Signed)
C/o heartburn, hx of same. States it started yesterday. C/o bil jaw pain and dizziness. States BP was up yesterday. Slight nausea, no vomiting.

## 2014-02-02 NOTE — Discharge Instructions (Signed)

## 2014-02-02 NOTE — ED Provider Notes (Signed)
CSN: [id number]     Arrival date & time 02/02/14  1108 History   First MD Initiated Contact with Patient 02/02/14 1118     Chief Complaint  Patient presents with  . Heartburn      HPI  Patient presents with chest pain. Describes as heartburn. Is only present when he belches. States last 24 hours every time he belches he "tastes acid".  No exertional pain or symptoms. History of an MI. He states he however symptoms when he had his MI. He had a stent placed. He had a PTCA of the stent in February. Symptoms started last night after meatballs and macaroni and cheese. No shortness or breath, no nausea, not diaphoretic, not lightheaded or syncopal. No palpitations.  Past Medical History  Diagnosis Date  . Hypertension   . Anginal pain   . NSTEMI (non-ST elevated myocardial infarction) 01/25/2012    "light," mid RCA ptca/des 2013 with NSTEMI  . Carotid bruit   . High cholesterol   . Type II diabetes mellitus dx'd 2013  . Sinus headache     "weekly" (07/24/2013)  . Sciatica    Past Surgical History  Procedure Laterality Date  . Inguinal hernia repair  ~ 1985    left  . Coronary angioplasty with stent placement  01/2012    "1"  . Cardiac catheterization  07/24/2013   Family History  Problem Relation Age of Onset  . Hypertension Mother   . Hypertension Father   . Hypertension Brother   . Diabetes Mother   . Diabetes Father    History  Substance Use Topics  . Smoking status: Former Smoker -- 0.50 packs/day for 29 years    Types: Cigarettes    Quit date: 06/11/2013  . Smokeless tobacco: Never Used  . Alcohol Use: 1.8 oz/week    3 Cans of beer per week     Comment: 07/24/2013 "40oz beer maybe once/wk"    Review of Systems  Constitutional: Negative for fever, chills, diaphoresis, appetite change and fatigue.  HENT: Negative for mouth sores, sore throat and trouble swallowing.   Eyes: Negative for visual disturbance.  Respiratory: Negative for cough, chest tightness, shortness of  breath and wheezing.   Cardiovascular: Positive for chest pain.  Gastrointestinal: Positive for nausea. Negative for vomiting, abdominal pain, diarrhea and abdominal distention.       Acid taste  Endocrine: Negative for polydipsia, polyphagia and polyuria.  Genitourinary: Negative for dysuria, frequency and hematuria.  Musculoskeletal: Negative for gait problem.  Skin: Negative for color change, pallor and rash.  Neurological: Negative for dizziness, syncope, light-headedness and headaches.  Hematological: Does not bruise/bleed easily.  Psychiatric/Behavioral: Negative for behavioral problems and confusion.      Allergies  Review of patient's allergies indicates no known allergies.  Home Medications   Prior to Admission medications   Medication Sig Start Date End Date Taking? Authorizing Provider  aspirin 81 MG tablet Take 81 mg by mouth daily.   Yes Historical Provider, MD  atorvastatin (LIPITOR) 20 MG tablet Take 20 mg by mouth daily.   Yes Historical Provider, MD  clopidogrel (PLAVIX) 75 MG tablet Take 75 mg by mouth daily with breakfast.   Yes Historical Provider, MD  isosorbide mononitrate (IMDUR) 30 MG 24 hr tablet Take 30 mg by mouth daily.   Yes Historical Provider, MD  lisinopril-hydrochlorothiazide (PRINZIDE,ZESTORETIC) 20-12.5 MG per tablet Take 1 tablet by mouth daily.    Yes Historical Provider, MD  metFORMIN (GLUCOPHAGE) 500 MG tablet Take 500 mg  by mouth daily with breakfast.   Yes Historical Provider, MD  metoprolol tartrate (LOPRESSOR) 25 MG tablet Take 25 mg by mouth daily.   Yes Historical Provider, MD  nitroGLYCERIN (NITROSTAT) 0.4 MG SL tablet Place 0.4 mg under the tongue every 5 (five) minutes as needed for chest pain.   Yes Historical Provider, MD  Cetirizine HCl (ZYRTEC ALLERGY) 10 MG CAPS Take 1 capsule (10 mg total) by mouth daily. 02/02/14   Rolland Porter, MD  pantoprazole (PROTONIX) 20 MG tablet Take 1 tablet (20 mg total) by mouth daily. 07/28/13   Nada Boozer, NP  pantoprazole (PROTONIX) 20 MG tablet Take 1 tablet (20 mg total) by mouth daily. 02/02/14   Rolland Porter, MD  sucralfate (CARAFATE) 1 G tablet Take 1 tablet (1 g total) by mouth 4 (four) times daily. 02/02/14   Rolland Porter, MD   BP 100/55  Pulse 54  Temp(Src) 98.4 F (36.9 C) (Oral)  Resp 18  Wt 205 lb (92.987 kg)  SpO2 100% Physical Exam  Constitutional: He is oriented to person, place, and time. He appears well-developed and well-nourished. No distress.  HENT:  Head: Normocephalic.  Eyes: Conjunctivae are normal. Pupils are equal, round, and reactive to light. No scleral icterus.  Neck: Normal range of motion. Neck supple. No thyromegaly present.  Cardiovascular: Normal rate and regular rhythm.  Exam reveals no gallop and no friction rub.   No murmur heard. Pulmonary/Chest: Effort normal and breath sounds normal. No respiratory distress. He has no wheezes. He has no rales.  Abdominal: Soft. Bowel sounds are normal. He exhibits no distension. There is no tenderness. There is no rebound.  Musculoskeletal: Normal range of motion.  Neurological: He is alert and oriented to person, place, and time.  Skin: Skin is warm and dry. No rash noted.  Psychiatric: He has a normal mood and affect. His behavior is normal.    ED Course  Procedures (including critical care time) Labs Review Labs Reviewed  CBC WITH DIFFERENTIAL - Abnormal; Notable for the following:    RBC 5.90 (*)    MCV 66.6 (*)    MCH 22.7 (*)    RDW 18.7 (*)    All other components within normal limits  COMPREHENSIVE METABOLIC PANEL - Abnormal; Notable for the following:    Glucose, Bld 122 (*)    GFR calc non Af Amer 79 (*)    All other components within normal limits  TROPONIN I  LIPASE, BLOOD  TROPONIN I    Imaging Review No results found.   EKG Interpretation   Date/Time:  Tuesday February 02 2014 11:32:21 EDT Ventricular Rate:  67 PR Interval:  170 QRS Duration: 90 QT Interval:  364 QTC  Calculation: 384 R Axis:   43 Text Interpretation:  Normal sinus rhythm Normal ECG Confirmed by Fayrene Fearing   MD, Naesha Buckalew ([id number]) on 02/02/2014 12:44:37 PM      MDM   Final diagnoses:  Chest pain, unspecified chest pain type  Gastroesophageal reflux disease with esophagitis    EKG shows no acute or ischemic changes. No change versus comparison. Her troponin is normal. Symptom free after GI cocktail. Await second enzyme.  Troponin normal remained symptom-free. Plan is discharged to followup with GI physician. Return if any acute or sudden changing. Continue his routine followup with his cardiologist.    Rolland Porter, MD 02/02/14 [phone]

## 2014-05-20 ENCOUNTER — Encounter (HOSPITAL_COMMUNITY): Payer: Self-pay | Admitting: Interventional Cardiology

## 2014-12-17 DIAGNOSIS — E119 Type 2 diabetes mellitus without complications: Secondary | ICD-10-CM

## 2015-06-16 ENCOUNTER — Observation Stay (HOSPITAL_BASED_OUTPATIENT_CLINIC_OR_DEPARTMENT_OTHER)
Admission: EM | Admit: 2015-06-16 | Discharge: 2015-06-17 | Disposition: A | Discharge status: Home / Self Care | Payer: 59 | Attending: Internal Medicine | Admitting: Internal Medicine

## 2015-06-16 ENCOUNTER — Encounter (HOSPITAL_BASED_OUTPATIENT_CLINIC_OR_DEPARTMENT_OTHER): Payer: Self-pay | Admitting: Emergency Medicine

## 2015-06-16 DIAGNOSIS — E119 Type 2 diabetes mellitus without complications: Secondary | ICD-10-CM

## 2015-06-16 DIAGNOSIS — I25119 Atherosclerotic heart disease of native coronary artery with unspecified angina pectoris: Secondary | ICD-10-CM | POA: Diagnosis not present

## 2015-06-16 DIAGNOSIS — I252 Old myocardial infarction: Secondary | ICD-10-CM | POA: Insufficient documentation

## 2015-06-16 DIAGNOSIS — I25110 Atherosclerotic heart disease of native coronary artery with unstable angina pectoris: Secondary | ICD-10-CM | POA: Diagnosis not present

## 2015-06-16 DIAGNOSIS — Z7902 Long term (current) use of antithrombotics/antiplatelets: Secondary | ICD-10-CM | POA: Diagnosis not present

## 2015-06-16 DIAGNOSIS — I200 Unstable angina: Secondary | ICD-10-CM | POA: Diagnosis not present

## 2015-06-16 DIAGNOSIS — Z955 Presence of coronary angioplasty implant and graft: Secondary | ICD-10-CM | POA: Insufficient documentation

## 2015-06-16 DIAGNOSIS — Z7984 Long term (current) use of oral hypoglycemic drugs: Secondary | ICD-10-CM | POA: Diagnosis not present

## 2015-06-16 DIAGNOSIS — E785 Hyperlipidemia, unspecified: Secondary | ICD-10-CM

## 2015-06-16 DIAGNOSIS — Z87891 Personal history of nicotine dependence: Secondary | ICD-10-CM | POA: Diagnosis not present

## 2015-06-16 DIAGNOSIS — R072 Precordial pain: Secondary | ICD-10-CM | POA: Insufficient documentation

## 2015-06-16 DIAGNOSIS — I2510 Atherosclerotic heart disease of native coronary artery without angina pectoris: Secondary | ICD-10-CM | POA: Diagnosis present

## 2015-06-16 DIAGNOSIS — R079 Chest pain, unspecified: Secondary | ICD-10-CM | POA: Diagnosis present

## 2015-06-16 DIAGNOSIS — Z7982 Long term (current) use of aspirin: Secondary | ICD-10-CM | POA: Diagnosis not present

## 2015-06-16 DIAGNOSIS — E7800 Pure hypercholesterolemia, unspecified: Secondary | ICD-10-CM | POA: Diagnosis not present

## 2015-06-16 DIAGNOSIS — I10 Essential (primary) hypertension: Secondary | ICD-10-CM | POA: Diagnosis not present

## 2015-06-16 DIAGNOSIS — Z79899 Other long term (current) drug therapy: Secondary | ICD-10-CM | POA: Diagnosis not present

## 2015-06-16 HISTORY — DX: Atherosclerotic heart disease of native coronary artery without angina pectoris: I25.10

## 2015-06-16 LAB — TROPONIN I: Troponin I: <0.03 ng/mL (ref <0.031)

## 2015-06-16 LAB — CBC
HCT: 45.8 % (ref 39.0–52.0)
Hemoglobin: 15.2 g/dL (ref 13.0–17.0)
MCH: 21.6 pg — ABNORMAL LOW (ref 26.0–34.0)
MCHC: 33.2 g/dL (ref 30.0–36.0)
MCV: 65.1 fL — AB (ref 78.0–100.0)
Platelets: 405 10*3/uL — ABNORMAL HIGH (ref 150–400)
RBC: 7.03 MIL/uL — AB (ref 4.22–5.81)
RDW: 19.4 % — AB (ref 11.5–15.5)
WBC: 8.2 10*3/uL (ref 4.0–10.5)

## 2015-06-16 LAB — COMPREHENSIVE METABOLIC PANEL
ALT: 32 U/L (ref 17–63)
ANION GAP: 7 (ref 5–15)
AST: 24 U/L (ref 15–41)
Albumin: 4.5 g/dL (ref 3.5–5.0)
Alkaline Phosphatase: 64 U/L (ref 38–126)
BUN: 15 mg/dL (ref 6–20)
CHLORIDE: 102 mmol/L (ref 101–111)
CO2: 25 mmol/L (ref 22–32)
CREATININE: 1.24 mg/dL (ref 0.61–1.24)
Calcium: 9.4 mg/dL (ref 8.9–10.3)
GFR calc Af Amer: >60 mL/min (ref >60)
GFR calc non Af Amer: >60 mL/min (ref >60)
Glucose, Bld: 121 mg/dL — ABNORMAL HIGH (ref 65–99)
POTASSIUM: 3.5 mmol/L (ref 3.5–5.1)
SODIUM: 134 mmol/L — AB (ref 135–145)
Total Bilirubin: 0.7 mg/dL (ref 0.3–1.2)
Total Protein: 8.8 g/dL — ABNORMAL HIGH (ref 6.5–8.1)

## 2015-06-16 LAB — APTT: aPTT: 32 seconds (ref 24–37)

## 2015-06-16 LAB — GLUCOSE, CAPILLARY
Glucose-Capillary: 82 mg/dL (ref 65–99)
Glucose-Capillary: 107 mg/dL — ABNORMAL HIGH (ref 65–99)

## 2015-06-16 MED ORDER — ISOSORBIDE MONONITRATE ER 30 MG PO TB24
30.0000 mg | ORAL_TABLET | Freq: Every day | ORAL | Status: DC
  Administered 2015-06-16 – 2015-06-17 (×2): 30 mg via ORAL
  Filled 2015-06-16 (×2): qty 1

## 2015-06-16 MED ORDER — LISINOPRIL 20 MG PO TABS
20.0000 mg | ORAL_TABLET | Freq: Every day | ORAL | Status: DC
  Administered 2015-06-16 – 2015-06-17 (×2): 20 mg via ORAL
  Filled 2015-06-16 (×2): qty 1

## 2015-06-16 MED ORDER — NITROGLYCERIN 0.4 MG SL SUBL: 0.4000 mg | SUBLINGUAL_TABLET | SUBLINGUAL | Status: DC | PRN

## 2015-06-16 MED ORDER — CLOPIDOGREL BISULFATE 75 MG PO TABS
75.0000 mg | ORAL_TABLET | Freq: Every day | ORAL | Status: DC
  Administered 2015-06-17: 75 mg via ORAL
  Filled 2015-06-16: qty 1

## 2015-06-16 MED ORDER — ASPIRIN 81 MG PO CHEW
243.0000 mg | CHEWABLE_TABLET | Freq: Once | ORAL | Status: AC
  Administered 2015-06-16: 243 mg via ORAL

## 2015-06-16 MED ORDER — HYDROCHLOROTHIAZIDE 12.5 MG PO CAPS
12.5000 mg | ORAL_CAPSULE | Freq: Every day | ORAL | Status: DC
  Administered 2015-06-16 – 2015-06-17 (×2): 12.5 mg via ORAL
  Filled 2015-06-16 (×2): qty 1

## 2015-06-16 MED ORDER — SODIUM CHLORIDE 0.9 % IV SOLN: 250.0000 mL | INTRAVENOUS | Status: DC | PRN

## 2015-06-16 MED ORDER — ONDANSETRON HCL 4 MG/2ML IJ SOLN: 4.0000 mg | Freq: Four times a day (QID) | INTRAMUSCULAR | Status: DC | PRN

## 2015-06-16 MED ORDER — ASPIRIN EC 81 MG PO TBEC
81.0000 mg | DELAYED_RELEASE_TABLET | Freq: Every day | ORAL | Status: DC
  Administered 2015-06-17: 81 mg via ORAL
  Filled 2015-06-16 (×2): qty 1

## 2015-06-16 MED ORDER — SODIUM CHLORIDE 0.9 % IJ SOLN
3.0000 mL | INTRAMUSCULAR | Status: DC | PRN
Start: 2015-06-16 — End: 2015-06-17

## 2015-06-16 MED ORDER — ASPIRIN 81 MG PO CHEW
CHEWABLE_TABLET | ORAL | Status: AC
  Filled 2015-06-16: qty 3

## 2015-06-16 MED ORDER — ENOXAPARIN SODIUM 40 MG/0.4ML ~~LOC~~ SOLN: 40.0000 mg | SUBCUTANEOUS | Status: DC

## 2015-06-16 MED ORDER — INSULIN ASPART 100 UNIT/ML ~~LOC~~ SOLN
0.0000 [IU] | Freq: Three times a day (TID) | SUBCUTANEOUS | Status: DC
  Administered 2015-06-17: 3 [IU] via SUBCUTANEOUS

## 2015-06-16 MED ORDER — METOPROLOL TARTRATE 25 MG PO TABS
25.0000 mg | ORAL_TABLET | Freq: Every day | ORAL | Status: DC
  Administered 2015-06-16 – 2015-06-17 (×2): 25 mg via ORAL
  Filled 2015-06-16 (×2): qty 1

## 2015-06-16 MED ORDER — NITROGLYCERIN 0.4 MG SL SUBL
SUBLINGUAL_TABLET | SUBLINGUAL | Status: AC
  Filled 2015-06-16: qty 3

## 2015-06-16 MED ORDER — LISINOPRIL-HYDROCHLOROTHIAZIDE 20-12.5 MG PO TABS: 1.0000 | ORAL_TABLET | Freq: Every day | ORAL | Status: DC

## 2015-06-16 MED ORDER — SODIUM CHLORIDE 0.9 % WEIGHT BASED INFUSION: 1.0000 mL/kg/h | INTRAVENOUS | Status: DC

## 2015-06-16 MED ORDER — SODIUM CHLORIDE 0.9 % IJ SOLN
3.0000 mL | Freq: Two times a day (BID) | INTRAMUSCULAR | Status: DC
  Administered 2015-06-16 – 2015-06-17 (×2): 3 mL via INTRAVENOUS

## 2015-06-16 MED ORDER — ACETAMINOPHEN 325 MG PO TABS
650.0000 mg | ORAL_TABLET | ORAL | Status: DC | PRN
  Administered 2015-06-16: 650 mg via ORAL
  Filled 2015-06-16: qty 2

## 2015-06-16 MED ORDER — SODIUM CHLORIDE 0.9 % WEIGHT BASED INFUSION
3.0000 mL/kg/h | INTRAVENOUS | Status: AC
  Administered 2015-06-17: 3 mL/kg/h via INTRAVENOUS

## 2015-06-16 MED ORDER — ATORVASTATIN CALCIUM 20 MG PO TABS
20.0000 mg | ORAL_TABLET | Freq: Every day | ORAL | Status: DC
  Administered 2015-06-16 – 2015-06-17 (×2): 20 mg via ORAL
  Filled 2015-06-16 (×2): qty 1

## 2015-06-16 MED ORDER — PANTOPRAZOLE SODIUM 20 MG PO TBEC
20.0000 mg | DELAYED_RELEASE_TABLET | Freq: Every day | ORAL | Status: DC
  Administered 2015-06-17: 20 mg via ORAL
  Filled 2015-06-16: qty 1

## 2015-06-16 NOTE — ED Notes (Signed)
Patient states he started having chest pain that has come and gone all morning.  Patient states he has a history of 2 MI.  He denies nausea, vomiting, diaphoretic or weakness.

## 2015-06-16 NOTE — ED Notes (Signed)
Report given to JC at [facility] and Shanda BumpsJessica, Charity fundraiserN at [facility]

## 2015-06-16 NOTE — ED Provider Notes (Signed)
CSN: [id number]     Arrival date & time 06/16/15  4098 History   First MD Initiated Contact with Patient 06/16/15 0940     Chief Complaint  Patient presents with  . Chest Pain     (Consider location/radiation/quality/duration/timing/severity/associated sxs/prior Treatment) Patient is a 50 y.o. male presenting with chest pain. The history is provided by the patient.  Chest Pain Pain location:  L chest Pain quality: pressure   Pain radiates to:  Does not radiate Pain radiates to the back: no   Pain severity:  Moderate Onset quality:  Gradual Duration:  2 days Progression:  Waxing and waning Chronicity:  Recurrent Relieved by:  Nothing Worsened by:  Nothing tried Ineffective treatments:  None tried Associated symptoms: no cough and no shortness of breath   Risk factors: coronary artery disease, diabetes mellitus, high cholesterol, hypertension and male sex   Risk factors: no smoking     Past Medical History  Diagnosis Date  . Hypertension   . Anginal pain (HCC)   . NSTEMI (non-ST elevated myocardial infarction) (HCC) 01/25/2012    "light," mid RCA ptca/des 2013 with NSTEMI  . Carotid bruit   . High cholesterol   . Type II diabetes mellitus (HCC) dx'd 2013  . Sinus headache     "weekly" (07/24/2013)  . Sciatica    Past Surgical History  Procedure Laterality Date  . Inguinal hernia repair  ~ 1985    left  . Coronary angioplasty with stent placement  01/2012    "1"  . Cardiac catheterization  07/24/2013  . Left heart catheterization with coronary angiogram N/A 01/26/2012    Procedure: LEFT HEART CATHETERIZATION WITH CORONARY ANGIOGRAM;  Surgeon: Lesleigh Noe, MD;  Location[facility] C[facility] LAB;  Service: Cardiovascular;  Laterality: N/A;  . Left heart catheterization with coronary angiogram N/A 07/24/2013    Procedure: LEFT HEART CATHETERIZATION WITH CORONARY ANGIOGRAM;  Surgeon: Runell Gess, MD;  Location: [facility] CATH LAB;  Service: Cardiovascular;  Laterality: N/A;  .  Percutaneous coronary stent intervention (pci-s) N/A 07/27/2013    Procedure: PERCUTANEOUS CORONARY STENT INTERVENTION (PCI-S);  Surgeon: Micheline Chapman, MD;  Location: [facility] CATH LAB;  Service: Cardiovascular;  Laterality: N/A;   Family History  Problem Relation Age of Onset  . Hypertension Mother   . Hypertension Father   . Hypertension Brother   . Diabetes Mother   . Diabetes Father    Social History  Substance Use Topics  . Smoking status: Former Smoker -- 0.50 packs/day for 29 years    Types: Cigarettes    Quit date: 06/11/2013  . Smokeless tobacco: Never Used  . Alcohol Use: 1.8 oz/week    3 Cans of beer per week     Comment: 07/24/2013 "40oz beer maybe once/wk"    Review of Systems  Respiratory: Negative for cough and shortness of breath.   Cardiovascular: Positive for chest pain.  All other systems reviewed and are negative.     Allergies  Review of patient's allergies indicates no known allergies.  Home Medications   Prior to Admission medications   Medication Sig Start Date End Date Taking? Authorizing Provider  aspirin 81 MG tablet Take 81 mg by mouth daily.   Yes Historical Provider, MD  atorvastatin (LIPITOR) 20 MG tablet Take 20 mg by mouth daily.   Yes Historical Provider, MD  clopidogrel (PLAVIX) 75 MG tablet Take 75 mg by mouth daily with breakfast.   Yes Historical Provider, MD  isosorbide mononitrate (IMDUR) 30 MG  24 hr tablet Take 30 mg by mouth daily.   Yes Historical Provider, MD  lisinopril-hydrochlorothiazide (PRINZIDE,ZESTORETIC) 20-12.5 MG per tablet Take 1 tablet by mouth daily.    Yes Historical Provider, MD  metFORMIN (GLUCOPHAGE) 500 MG tablet Take 500 mg by mouth daily with breakfast.   Yes Historical Provider, MD  metoprolol tartrate (LOPRESSOR) 25 MG tablet Take 25 mg by mouth daily.   Yes Historical Provider, MD  nitroGLYCERIN (NITROSTAT) 0.4 MG SL tablet Place 0.4 mg under the tongue every 5 (five) minutes as needed for chest pain.   Yes  Historical Provider, MD  pantoprazole (PROTONIX) 20 MG tablet Take 1 tablet (20 mg total) by mouth daily. 07/28/13  Yes Leone Brand, NP  Cetirizine HCl (ZYRTEC ALLERGY) 10 MG CAPS Take 1 capsule (10 mg total) by mouth daily. 02/02/14   Rolland Porter, MD  pantoprazole (PROTONIX) 20 MG tablet Take 1 tablet (20 mg total) by mouth daily. 02/02/14   Rolland Porter, MD  sucralfate (CARAFATE) 1 G tablet Take 1 tablet (1 g total) by mouth 4 (four) times daily. 02/02/14   Rolland Porter, MD   BP 166/89 mmHg  Pulse 88  Temp(Src) 98.1 F (36.7 C) (Oral)  Resp 16  Ht 6\' 2"  (1.88 m)  Wt 213 lb (96.616 kg)  BMI 27.34 kg/m2  SpO2 100% Physical Exam  Constitutional: He is oriented to person, place, and time. He appears well-developed and well-nourished. No distress.  HENT:  Head: Normocephalic and atraumatic.  Eyes: Conjunctivae are normal.  Neck: Neck supple. No tracheal deviation present.  Cardiovascular: Normal rate, regular rhythm and normal heart sounds.   Pulmonary/Chest: Effort normal and breath sounds normal. No respiratory distress. He has no wheezes. He has no rales. He exhibits no tenderness.  Abdominal: Soft. He exhibits no distension. There is no tenderness.  Neurological: He is alert and oriented to person, place, and time.  Skin: Skin is warm and dry.  Psychiatric: He has a normal mood and affect.  Vitals reviewed.   ED Course  Procedures (including critical care time) Labs Review Labs Reviewed  CBC - Abnormal; Notable for the following:    RBC 7.03 (*)    MCV 65.1 (*)    MCH 21.6 (*)    RDW 19.4 (*)    Platelets 405 (*)    All other components within normal limits  COMPREHENSIVE METABOLIC PANEL - Abnormal; Notable for the following:    Sodium 134 (*)    Glucose, Bld 121 (*)    Total Protein 8.8 (*)    All other components within normal limits  APTT  TROPONIN I  I-STAT CHEM 8, ED    Imaging Review No results found. I have personally reviewed and evaluated these images and lab  results as part of my medical decision-making.   EKG Interpretation   Date/Time:  Thursday June 16 2015 09:44:35 EST Ventricular Rate:  94 PR Interval:  149 QRS Duration: 89 QT Interval:  325 QTC Calculation: 406 R Axis:   44 Text Interpretation:  Sinus rhythm T wave abnormality, consider  inferolateral ischemia Confirmed by Penelope Fittro MD, Ciria Bernardini ([id number]) on 06/16/2015  10:32:51 AM      MDM   Final diagnoses:  Unstable angina (HCC)    50 year old male with history of previous MI, last stent placed in 2015, presents with intermittent chest pressure over the left side at rest concerning for possible unstable angina. Troponin is negative but EKG is concerning for T-wave changes in lateral precordial leads.  Provided aspirin, refused nitroglycerin. Cardiology was consulted. Cardiology Dr. Rennis GoldenHilty accepted the patient in [facility] for further evaluation and high-risk ACS rule out.  Lyndal Pulleyaniel Damoni Causby, MD 06/16/15 [phone]

## 2015-06-16 NOTE — H&P (Addendum)
Patient ID: Alan Copeland MRN: [id number], DOB/AGE: 01-26-1966   Admit date: 06/16/2015   Primary Physician: [facility] Primary Cardiologist: [facility]; Seen by Dr. Eden EmmsNishan in the past   WNU:UVOZDHPI:Alan Copeland is a 50 y.o. male with past medical history of CAD (s/p DES to RCA in 2013, restenosis in 2015 with DES to RCA), Type 2 DM, HTN, and HLD who [facility] on 06/16/2015 for new onset chest pain.   The patient reports having episodes of shooting chest pain for the past week lasting less than one minute with each episode. The pain comes on with activity or at rest and has no associated symptoms. He reports having bilateral jaw pain for several days last week which he says is representative of his angina. He denies any associated dyspnea, diaphoresis, nausea, or vomiting. Has not taken any SL NTG during these episodes due to his Rx being expired.  He denies any chest pain or jaw pain at this time.   [facility], his initial troponin was negative. Creatinine at 1.24. Platelets at 405. EKG showed inferolateral TWI, not present on previous tracings.  His last cardiac catheterization was in 2015 and showed 95-99% restenosis of the previous RCA stent. Successful PCI was accomplished with a Promus DES.   He has not been seen by his Cardiologist in over a year. Reports getting medication [facility]. Says he is compliant with his ASA, statin, BB, ACE-I, and Plavix. Did quit taking Imdur several months ago due to running out. Says it "worked well" for him in the past.    Home Medications Prior to Admission medications   Medication Sig Start Date End Date Taking? Authorizing Provider  aspirin 81 MG tablet Take 81 mg by mouth daily.   Yes Historical Provider, MD  atorvastatin (LIPITOR) 20 MG tablet Take 20 mg by mouth daily.   Yes Historical Provider, MD  clopidogrel (PLAVIX) 75 MG tablet  Take 75 mg by mouth daily with breakfast.   Yes Historical Provider, MD  isosorbide mononitrate (IMDUR) 30 MG 24 hr tablet Take 30 mg by mouth daily.   Yes Historical Provider, MD  lisinopril-hydrochlorothiazide (PRINZIDE,ZESTORETIC) 20-12.5 MG per tablet Take 1 tablet by mouth daily.    Yes Historical Provider, MD  metFORMIN (GLUCOPHAGE) 500 MG tablet Take 500 mg by mouth daily with breakfast.   Yes Historical Provider, MD  metoprolol tartrate (LOPRESSOR) 25 MG tablet Take 25 mg by mouth daily.   Yes Historical Provider, MD  nitroGLYCERIN (NITROSTAT) 0.4 MG SL tablet Place 0.4 mg under the tongue every 5 (five) minutes as needed for chest pain.   Yes Historical Provider, MD  pantoprazole (PROTONIX) 20 MG tablet Take 1 tablet (20 mg total) by mouth daily. 07/28/13  Yes Leone BrandLaura R Ingold, NP  Cetirizine HCl (ZYRTEC ALLERGY) 10 MG CAPS Take 1 capsule (10 mg total) by mouth daily. 02/02/14   Rolland PorterMark Gomer, MD  pantoprazole (PROTONIX) 20 MG tablet Take 1 tablet (20 mg total) by mouth daily. 02/02/14   Rolland PorterMark Arling, MD  sucralfate (CARAFATE) 1 G tablet Take 1 tablet (1 g total) by mouth 4 (four) times daily. 02/02/14   Rolland PorterMark Gagan, MD    Allergies No Known Allergies  Past Medical History Past Medical History  Diagnosis Date  . Hypertension   . Anginal pain (HCC)   . CAD (coronary artery disease) 01/25/2012    a. RCA  ptca/des 2013 with NSTEMI b. restenosis of RCA in 07/2013 w/ DES placed  . Carotid bruit   . High cholesterol   . Type II diabetes mellitus (HCC) dx'd 2013  . Sinus headache     "weekly" (07/24/2013)  . Sciatica      Surgical History Past Surgical History  Procedure Laterality Date  . Inguinal hernia repair  ~ 1985    left  . Coronary angioplasty with stent placement  01/2012    "1"  . Cardiac catheterization  07/24/2013  . Left heart catheterization with coronary angiogram N/A 01/26/2012    Procedure: LEFT HEART CATHETERIZATION WITH CORONARY ANGIOGRAM;  Surgeon: Lesleigh Noe, MD;   Location: [facility] CATH LAB;  Service: Cardiovascular;  Laterality: N/A;  . Left heart catheterization with coronary angiogram N/A 07/24/2013    Procedure: LEFT HEART CATHETERIZATION WITH CORONARY ANGIOGRAM;  Surgeon: Runell Gess, MD;  Location: [facility] CATH LAB;  Service: Cardiovascular;  Laterality: N/A;  . Percutaneous coronary stent intervention (pci-s) N/A 07/27/2013    Procedure: PERCUTANEOUS CORONARY STENT INTERVENTION (PCI-S);  Surgeon: Micheline Chapman, MD;  Location: [facility] CATH LAB;  Service: Cardiovascular;  Laterality: N/A;     Family History Family History  Problem Relation Age of Onset  . Hypertension Mother   . Hypertension Father   . Hypertension Brother   . Diabetes Mother   . Diabetes Father     Social History Social History   Social History  . Marital Status: Married    Spouse Name: N/A  . Number of Children: N/A  . Years of Education: N/A   Occupational History  . Not on file.   Social History Main Topics  . Smoking status: Former Smoker -- 0.50 packs/day for 29 years    Types: Cigarettes    Quit date: 06/11/2013  . Smokeless tobacco: Never Used  . Alcohol Use: 0.6 oz/week    1 Cans of beer per week     Comment: 1 can of beer every 1-2 weeks  . Drug Use: No     Comment: Last used in 2013  . Sexual Activity: Yes   Other Topics Concern  . Not on file   Social History Narrative     Review of Systems General:  No chills, fever, night sweats or weight changes.  Cardiovascular:  No dyspnea on exertion, edema, orthopnea, palpitations, paroxysmal nocturnal dyspnea. Positive for chest pain and jaw pain. Dermatological: No rash, lesions/masses Respiratory: No cough, dyspnea Urologic: No hematuria, dysuria Abdominal:   No nausea, vomiting, diarrhea, bright red blood per rectum, melena, or hematemesis Neurologic:  No visual changes, wkns, changes in mental status. All other systems reviewed and are otherwise negative except as noted above.   Physical  Exam: Blood pressure 126/72, pulse 75, temperature 98.3 F (36.8 C), temperature source Oral, resp. rate 16, height 6\' 2"  (1.88 m), weight 206 lb 2.1 oz (93.5 kg), SpO2 100 %.  General: Well developed, well nourished [place] American ,male in no acute distress. Head: Normocephalic, atraumatic, sclera non-icteric, no xanthomas, nares are without discharge. Dentition:  Neck: No carotid bruits. JVD not elevated.  Lungs: Respirations regular and unlabored, without wheezes or rales.  Heart: Regular rate and rhythm. No S3 or S4.  No murmur, no rubs, or gallops appreciated. Abdomen: Soft, non-tender, non-distended with normoactive bowel sounds. No hepatomegaly. No rebound/guarding. No obvious abdominal masses. Msk:  Strength and tone appear normal for age. No joint deformities or effusions. Extremities: No clubbing or cyanosis. No edema.  Distal pedal pulses are 2+ bilaterally. Neuro: Alert and oriented X 3. Moves all extremities spontaneously. No focal deficits noted. Psych:  Responds to questions appropriately with a normal affect. Skin: No rashes or lesions noted   Labs Lab Results  Component Value Date   WBC 8.2 06/16/2015   HGB 15.2 06/16/2015   HCT 45.8 06/16/2015   MCV 65.1* 06/16/2015   PLT 405* 06/16/2015     Recent Labs Lab 06/16/15 0940  NA 134*  K 3.5  CL 102  CO2 25  BUN 15  CREATININE 1.24  CALCIUM 9.4  PROT 8.8*  BILITOT 0.7  ALKPHOS 64  ALT 32  AST 24  GLUCOSE 121*   Troponin (Point of Care Test) No results for input(s): TROPIPOC in the last 72 hours.  Recent Labs  06/16/15 0940  TROPONINI <0.03   No results for input(s): INR in the last 72 hours. Lab Results  Component Value Date   CHOL 160 01/26/2012   HDL 99 01/26/2012   LDLCALC 51 01/26/2012   TRIG 50 01/26/2012     ECG: NSR, HR 94, inferolateral TWI most notable in Leads III, aVF.   Echo: None on File   Cardiac Catheterization: 07/27/2014 Procedure: PTCA and stenting of the  mid-RCA  Indication: 50 year old gentleman with known CAD. He presented with progressive anginal symptoms. He underwent cardiac cath and attempted PCI of the right coronary artery last week. There was difficulty wiring the RCA despite a prolonged attempt and multiple wires. The patient has marked tortuosity of the proximal right coronary artery. He returns today for PCI of severe in-stent restenosis in the mid vessel. There is a 95-99% stenosis and has previously placed stent from 2013.  Procedural Details: The right wrist was prepped, draped, and anesthetized with 1% lidocaine. Using the modified Seldinger technique, a 5/6 Fr sheath was introduced into the radial artery. 3 mg verapamil was administered through the radial sheath. Weight-based heparin was given for anticoagulation. Once a therapeutic ACT was achieved, a 6 [place] AL-1 guide catheter was inserted. I was unable to engage the RCA with this. This was changed out for a JR 4 guide. A cougar coronary guidewire was used to cross the lesion. The lesion was predilated with a 2.5 mm balloon. Following predilatation, there was no flow around the proximal vessel. I was concerned about the possibility of dissection or vasospasm. Intracoronary nitroglycerin was administered. The wire was pulled back into the proximal vessel. TIMI-3 flow returned but there was diffuse spasm seen in multiple areas of the RCA. Additional nitroglycerin was administered. At that point, the lesion was rewired and then the stented segment was dilated with a 3.0 mm cutting balloon on multiple inflations to 12 atmospheres. There was a good result through most of the stent, but there was residual stenosis at the proximal edge. There was mild stenosis at the distal edge. Considering his diabetes and aggressive restenosis, I felt the best long-term solution would be a second stent. The previously implanted stent was a 2.5 x 16 mm Promus DES. I used a 3.0 x 24 mm Promus DES which was  positioned so that there was stent overhang proximally and distally. The stent was deployed at 12 atmospheres. The stent was postdilated with a 3.25 mm noncompliant balloon to 16 atmospheres. Following PCI, there was 0% residual stenosis and TIMI-3 flow. Final angiography confirmed an excellent result. The patient tolerated the procedure well. There were no immediate procedural complications. A TR band was used for radial hemostasis.  The patient was transferred to the post catheterization recovery area for further monitoring.  Lesion Data: Vessel: RCA/Mid (in-stent restenosis) Percent stenosis (pre): 95 TIMI-flow (pre): 3 Stent: 3.0x24 mm Promus DES Percent stenosis (post): 0 TIMI-flow (post): 3  Conclusions: Successful PCI of severe in-stent restenosis in the mid-RCA, treated with a Promus DES  Recommendations: Continued DAPT with ASA and plavix at least another 12 months without interruption. Outpatient follow-up with Dr Hanley Hays.   ASSESSMENT AND PLAN:   1. Unstable angina/ History of CAD  - history of CAD s/p DES to RCA in 2013, restenosis in 2015 with DES to RCA - having episodes of shooting chest pain for the past week, not present with past MI's. Has been experiencing bilateral jaw pain for several days which is consistent with his previous symptoms. Denies any associated dyspnea, diaphoresis, nausea, or vomiting. - initial troponin negative. Will obtain cyclic values. - EKG showed new inferolateral TWI when compared to previous tracings.  - continue ASA, statin, ACE-I, Plavix, and BB. Will restart Imdur 30mg  daily. - would need to consider LHC tomorrow to further evaluate his anginal symptoms. Cath around 3-4 in the afternoon. Will give clear liquids.   2. Hypertension - continue PTA medications - continue to monitor  3. DM type 2 (diabetes mellitus, type 2)  - patient believes his A1c was last checked 8-10 months ago. Will recheck. - last took Metformin 72 hours ago due to  running out of the medications - SSI while admitted  4. HLD (hyperlipidemia) - check Lipid panel - continue statin therapy   Signed, Ellsworth Lennox, PA-C 06/16/2015, 4:33 PM Pager: [phone]  Attending Note:   The patient was seen and examined.  Agree with assessment and plan as noted above.  Changes made to the above note as needed.  1. Unstable angina:  Neck pain Symptoms are very similar to his previous angina pain .   Now also has TWI in the inferior leads.  Will schedule for cath with possible PCI in the am Discussed risks , benefits, options.   He understands and agrees.  On for cath tomorrow afternoon    Alvia Grove., MD, [facility] 06/16/2015, 5:05 PM [address],  [address] Office [phone] Pager [phone]

## 2015-06-17 ENCOUNTER — Other Ambulatory Visit: Payer: Self-pay | Admitting: Physician Assistant

## 2015-06-17 ENCOUNTER — Encounter (HOSPITAL_COMMUNITY)
Admission: EM | Disposition: A | Discharge status: Home / Self Care | Payer: Self-pay | Source: Home / Self Care | Attending: Emergency Medicine

## 2015-06-17 ENCOUNTER — Encounter (HOSPITAL_COMMUNITY): Payer: Self-pay | Admitting: Physician Assistant

## 2015-06-17 DIAGNOSIS — I10 Essential (primary) hypertension: Secondary | ICD-10-CM | POA: Diagnosis not present

## 2015-06-17 DIAGNOSIS — R072 Precordial pain: Secondary | ICD-10-CM

## 2015-06-17 DIAGNOSIS — I25110 Atherosclerotic heart disease of native coronary artery with unstable angina pectoris: Secondary | ICD-10-CM | POA: Diagnosis not present

## 2015-06-17 DIAGNOSIS — E7800 Pure hypercholesterolemia, unspecified: Secondary | ICD-10-CM | POA: Diagnosis not present

## 2015-06-17 DIAGNOSIS — E119 Type 2 diabetes mellitus without complications: Secondary | ICD-10-CM | POA: Diagnosis not present

## 2015-06-17 HISTORY — PX: CARDIAC CATHETERIZATION: SHX172

## 2015-06-17 LAB — LIPID PANEL
Cholesterol: 149 mg/dL (ref 0–200)
HDL: 38 mg/dL — ABNORMAL LOW (ref >40)
LDL Cholesterol: 98 mg/dL (ref 0–99)
Total CHOL/HDL Ratio: 3.9 RATIO
Triglycerides: 67 mg/dL (ref <150)
VLDL: 13 mg/dL (ref 0–40)

## 2015-06-17 LAB — GLUCOSE, CAPILLARY
GLUCOSE-CAPILLARY: 70 mg/dL (ref 65–99)
Glucose-Capillary: 105 mg/dL — ABNORMAL HIGH (ref 65–99)
Glucose-Capillary: 164 mg/dL — ABNORMAL HIGH (ref 65–99)

## 2015-06-17 LAB — BASIC METABOLIC PANEL
ANION GAP: 11 (ref 5–15)
BUN: 18 mg/dL (ref 6–20)
CALCIUM: 8.9 mg/dL (ref 8.9–10.3)
CO2: 25 mmol/L (ref 22–32)
CREATININE: 1.34 mg/dL — AB (ref 0.61–1.24)
Chloride: 101 mmol/L (ref 101–111)
GFR calc Af Amer: >60 mL/min (ref >60)
GLUCOSE: 111 mg/dL — AB (ref 65–99)
Potassium: 4.1 mmol/L (ref 3.5–5.1)
Sodium: 137 mmol/L (ref 135–145)

## 2015-06-17 LAB — PROTIME-INR
INR: 1.06 (ref 0.00–1.49)
PROTHROMBIN TIME: 14 s (ref 11.6–15.2)

## 2015-06-17 LAB — TROPONIN I

## 2015-06-17 SURGERY — LEFT HEART CATH AND CORONARY ANGIOGRAPHY
Anesthesia: LOCAL

## 2015-06-17 MED ORDER — METOPROLOL TARTRATE 25 MG PO TABS: 12.5000 mg | ORAL_TABLET | Freq: Two times a day (BID) | ORAL | Status: DC

## 2015-06-17 MED ORDER — LISINOPRIL-HYDROCHLOROTHIAZIDE 20-12.5 MG PO TABS: 1.0000 | ORAL_TABLET | Freq: Every day | ORAL | Status: DC

## 2015-06-17 MED ORDER — HEPARIN SODIUM (PORCINE) 1000 UNIT/ML IJ SOLN
INTRAMUSCULAR | Status: DC | PRN
  Administered 2015-06-17: 4500 [IU] via INTRAVENOUS

## 2015-06-17 MED ORDER — MIDAZOLAM HCL 2 MG/2ML IJ SOLN
INTRAMUSCULAR | Status: DC | PRN
  Administered 2015-06-17: 2 mg via INTRAVENOUS

## 2015-06-17 MED ORDER — SODIUM CHLORIDE 0.9 % IV SOLN
INTRAVENOUS | Status: AC
  Administered 2015-06-17: 15:00:00 via INTRAVENOUS

## 2015-06-17 MED ORDER — VERAPAMIL HCL 2.5 MG/ML IV SOLN
INTRAVENOUS | Status: AC
  Filled 2015-06-17: qty 2

## 2015-06-17 MED ORDER — SODIUM CHLORIDE 0.9 % IJ SOLN: 3.0000 mL | INTRAMUSCULAR | Status: DC | PRN

## 2015-06-17 MED ORDER — IOHEXOL 350 MG/ML SOLN
INTRAVENOUS | Status: DC | PRN
  Administered 2015-06-17: 90 mL via INTRA_ARTERIAL

## 2015-06-17 MED ORDER — SODIUM CHLORIDE 0.9 % IV SOLN: 250.0000 mL | INTRAVENOUS | Status: DC | PRN

## 2015-06-17 MED ORDER — HEPARIN (PORCINE) IN NACL 2-0.9 UNIT/ML-% IJ SOLN
INTRAMUSCULAR | Status: DC | PRN
  Administered 2015-06-17: 15:00:00 via INTRA_ARTERIAL

## 2015-06-17 MED ORDER — MIDAZOLAM HCL 2 MG/2ML IJ SOLN
INTRAMUSCULAR | Status: AC
  Filled 2015-06-17: qty 2

## 2015-06-17 MED ORDER — HEPARIN SODIUM (PORCINE) 1000 UNIT/ML IJ SOLN
INTRAMUSCULAR | Status: AC
  Filled 2015-06-17: qty 1

## 2015-06-17 MED ORDER — FENTANYL CITRATE (PF) 100 MCG/2ML IJ SOLN
INTRAMUSCULAR | Status: DC | PRN
  Administered 2015-06-17: 50 ug via INTRAVENOUS

## 2015-06-17 MED ORDER — HEPARIN (PORCINE) IN NACL 2-0.9 UNIT/ML-% IJ SOLN
INTRAMUSCULAR | Status: DC | PRN
  Administered 2015-06-17: 15:00:00

## 2015-06-17 MED ORDER — SODIUM CHLORIDE 0.9 % IJ SOLN
3.0000 mL | Freq: Two times a day (BID) | INTRAMUSCULAR | Status: DC
  Administered 2015-06-17: 3 mL via INTRAVENOUS

## 2015-06-17 MED ORDER — ATORVASTATIN CALCIUM 20 MG PO TABS: 20.0000 mg | ORAL_TABLET | Freq: Every day | ORAL | Status: DC

## 2015-06-17 MED ORDER — FENTANYL CITRATE (PF) 100 MCG/2ML IJ SOLN
INTRAMUSCULAR | Status: AC
  Filled 2015-06-17: qty 2

## 2015-06-17 MED ORDER — NITROGLYCERIN 0.4 MG SL SUBL: 0.4000 mg | SUBLINGUAL_TABLET | SUBLINGUAL | Status: DC | PRN

## 2015-06-17 MED ORDER — NITROGLYCERIN 1 MG/10 ML FOR IR/CATH LAB
INTRA_ARTERIAL | Status: AC
  Filled 2015-06-17: qty 10

## 2015-06-17 MED ORDER — ISOSORBIDE MONONITRATE ER 30 MG PO TB24: 30.0000 mg | ORAL_TABLET | Freq: Every day | ORAL | Status: DC

## 2015-06-17 MED ORDER — PANTOPRAZOLE SODIUM 20 MG PO TBEC: 20.0000 mg | DELAYED_RELEASE_TABLET | Freq: Every day | ORAL | Status: DC

## 2015-06-17 MED ORDER — LIDOCAINE HCL (PF) 1 % IJ SOLN
INTRAMUSCULAR | Status: AC
  Filled 2015-06-17: qty 30

## 2015-06-17 MED ORDER — METFORMIN HCL 500 MG PO TABS: 500.0000 mg | ORAL_TABLET | Freq: Every day | ORAL | Status: DC

## 2015-06-17 MED ORDER — HEPARIN (PORCINE) IN NACL 2-0.9 UNIT/ML-% IJ SOLN
INTRAMUSCULAR | Status: AC
  Filled 2015-06-17: qty 1000

## 2015-06-17 SURGICAL SUPPLY — 12 items

## 2015-06-17 NOTE — Progress Notes (Signed)
Patient Name: Alan Copeland Date of Encounter: 06/17/2015  Primary Cardiologist: Dr. Hanley Hays at [facility]; Seen by Dr. Eden Emms in the past   Principal Problem:   Unstable angina [facility]) Active Problems:   Hypertension   CAD (coronary artery disease)   DM type 2 (diabetes mellitus, type 2) (HCC)   HLD (hyperlipidemia)    SUBJECTIVE  Still has intermittent CP this morning.   CURRENT MEDS . aspirin EC  81 mg Oral Daily  . atorvastatin  20 mg Oral Daily  . clopidogrel  75 mg Oral Q breakfast  . enoxaparin (LOVENOX) injection  40 mg Subcutaneous Q24H  . lisinopril  20 mg Oral Daily   And  . hydrochlorothiazide  12.5 mg Oral Daily  . insulin aspart  0-15 Units Subcutaneous TID WC  . isosorbide mononitrate  30 mg Oral Daily  . metoprolol tartrate  25 mg Oral Daily  . pantoprazole  20 mg Oral Daily    OBJECTIVE  Filed Vitals:   06/16/15 1542 06/16/15 2005 06/17/15 0338 06/17/15 0900  BP: 126/72 122/71 97/53 127/62  Pulse: 75 72 71   Temp: 98.3 F (36.8 C) 98.7 F (37.1 C) 97.6 F (36.4 C)   TempSrc: Oral Oral Oral   Resp: 16 18 17    Height: 6\' 2"  (1.88 m)     Weight: 206 lb 2.1 oz (93.5 kg)  204 lb (92.534 kg)   SpO2: 100% 100% 100% 100%    Intake/Output Summary (Last 24 hours) at 06/17/15 1344 Last data filed at 06/17/15 1000  Gross per 24 hour  Intake 815.04 ml  Output      0 ml  Net 815.04 ml   Filed Weights   06/16/15 0949 06/16/15 1542 06/17/15 0338  Weight: 213 lb (96.616 kg) 206 lb 2.1 oz (93.5 kg) 204 lb (92.534 kg)    PHYSICAL EXAM  General: Pleasant, NAD. Neuro: Alert and oriented X 3. Moves all extremities spontaneously. Psych: Normal affect. HEENT:  Normal  Neck: Supple without bruits or JVD. Lungs:  Resp regular and unlabored, CTA. Heart: RRR no s3, s4, or murmurs. Abdomen: Soft, non-tender, non-distended, BS + x 4.  Extremities: No clubbing, cyanosis or edema. DP/PT/Radials 2+ and equal bilaterally.  Accessory Clinical  Findings  CBC  Recent Labs  06/16/15 0940  WBC 8.2  HGB 15.2  HCT 45.8  MCV 65.1*  PLT 405*   Basic Metabolic Panel  Recent Labs  06/16/15 0940 06/17/15 0435  NA 134* 137  K 3.5 4.1  CL 102 101  CO2 25 25  GLUCOSE 121* 111*  BUN 15 18  CREATININE 1.24 1.34*  CALCIUM 9.4 8.9   Liver Function Tests  Recent Labs  06/16/15 0940  AST 24  ALT 32  ALKPHOS 64  BILITOT 0.7  PROT 8.8*  ALBUMIN 4.5   Cardiac Enzymes  Recent Labs  06/16/15 1728 06/16/15 2259 06/17/15 0435  TROPONINI <0.03 <0.03 <0.03   Fasting Lipid Panel  Recent Labs  06/17/15 0502  CHOL 149  HDL 38*  LDLCALC 98  TRIG 67  CHOLHDL 3.9    TELE NSR without significant ST-T wave changes    ECG  NSR without significant ST-T wave changes, Inferior lateral TWI resolved   Radiology/Studies  No results found.  ASSESSMENT AND PLAN  1. Unstable angina with inferolateral TWI (TWI resolved on repeat EKG 1/6)  - Last cath 2015 95-99% restenosis of the previous RCA stent. Successful PCI was accomplished with a Promus DES.   -  plan for cardiac cath today. Continue ASA, lipitor, plavix, Imdur, lisinopril/HCTZ. Not sure why he is on 25mg  metoprolol tartrate daily, consider change to 12.5mg  BID.   2. CAD (s/p DES to RCA in 2013, restenosis in 2015 with DES to RCA)  3. Type 2 DM  4. HTN 5. HLD: Lipid panel Chol 149, Trig 67, HDL 38, LDL 98  Signed, Azalee CourseMeng, Hao PA-C Pager: [phone]   I have personally seen and examined this patient. I agree with the assessment and plan as outlined above.   MCALHANY,CHRISTOPHER 06/17/2015 3:03 PM

## 2015-06-17 NOTE — Discharge Instructions (Signed)
No driving for 24 hours. No lifting over 5 lbs for 1 week. No sexual activity for 1 week. You may return to work on 06/20/2015. Keep procedure site clean & dry. If you notice increased pain, swelling, bleeding or pus, call/return!  You may shower, but no soaking baths/hot tubs/pools for 1 week.   Please hold your Metformin for 48 hours after cath given interaction with contrast dye. You can restart on 06/19/2015.

## 2015-06-17 NOTE — Discharge Summary (Signed)
Discharge Summary   Patient ID: Alan Copeland,  MRN: [id number], DOB/AGE: 08-14-1965 50 y.o.  Admit date: 06/16/2015 Discharge date: 06/17/2015  Primary Care Provider: [facility] Primary Cardiologist: Dr. Hanley Hays at [facility]; Seen by Dr. Eden Emms in the past  Discharge Diagnoses Principal Problem:   Unstable angina [facility]) Active Problems:   Hypertension   CAD (coronary artery disease)   DM type 2 (diabetes mellitus, type 2) (HCC)   HLD (hyperlipidemia)   Precordial pain   Allergies No Known Allergies  Procedures  Cardiac catheterization 06/17/2015 Conclusion    1. Single vessel CAD. 2. Patent stent mid RCA with minimal diffuse restenosis.  3. Normal LV systolic function.   Recommendations: Continue medical management of CAD.       Hospital Course  [name] is a 50 yo male with PMH of CAD s/p DES to RCA in 2013 with restenosis in 2015 treated with DES to RCA, type 2 DM, HTN and HDL presented to the [facility] on 06/16/2015 for new onset of chest pain that is reminiscent of previous angina. He has not taken any nitroglycerin during this episodes due to his previous prescription being expired. His initial troponin was negative. Creatinine 1.24. Platelet 405. EKG showed inferolateral T-wave inversion not present on previous EKG. He has not seen his cardiologist in over a year. Given that EKG changes and his symptom, the decision was made to undergo diagnostic cardiac catheterization.  Overnight, his serial troponin was negative. Repeat EKG on the following day showed resolution of T-wave inversion. He underwent a scheduled cardiac catheterization on 1/6 which showed single-vessel CAD with patent stent in mid RCA with minimal diffuse restenosis, normal LV systolic function. Lipid panel obtained on the same day showed cholesterol 149, triglyceride 67, HDL 38, LDL 98. She is deemed stable for discharge from cardiology perspective.  He will need to follow-up with his primary cardiologist closely. I have refilled his medication including sublingual nitroglycerin, lisinopril/HCTZ, Imdur, Lipitor and Plavix. He was previously taking metoprolol tartrate 25 mg daily, and have changed this to 12.5 mg twice a day.   Discharge Vitals Blood pressure 117/59, pulse 79, temperature 97.6 F (36.4 C), temperature source Oral, resp. rate 9, height 6\' 2"  (1.88 m), weight 204 lb (92.534 kg), SpO2 100 %.  Filed Weights   06/16/15 0949 06/16/15 1542 06/17/15 0338  Weight: 213 lb (96.616 kg) 206 lb 2.1 oz (93.5 kg) 204 lb (92.534 kg)    Labs  CBC  Recent Labs  06/16/15 0940  WBC 8.2  HGB 15.2  HCT 45.8  MCV 65.1*  PLT 405*   Basic Metabolic Panel  Recent Labs  06/16/15 0940 06/17/15 0435  NA 134* 137  K 3.5 4.1  CL 102 101  CO2 25 25  GLUCOSE 121* 111*  BUN 15 18  CREATININE 1.24 1.34*  CALCIUM 9.4 8.9   Liver Function Tests  Recent Labs  06/16/15 0940  AST 24  ALT 32  ALKPHOS 64  BILITOT 0.7  PROT 8.8*  ALBUMIN 4.5   Cardiac Enzymes  Recent Labs  06/16/15 1728 06/16/15 2259 06/17/15 0435  TROPONINI <0.03 <0.03 <0.03   Fasting Lipid Panel  Recent Labs  06/17/15 0502  CHOL 149  HDL 38*  LDLCALC 98  TRIG 67  CHOLHDL 3.9   Disposition  Pt is being discharged home today in good condition.  Follow-up Plans & Appointments      Follow-up Information    Follow up with  Sherrill Raringosario, Raymond T, MD.   Specialty:  Family Medicine   Why:  Please followup with your cardiologist as soon as possible.   Contact information:   [address] [facility] [place] [id number] [phone]       Please follow up.   Why:  continue to followup with your primary care physician      Discharge Medications    Medication List    TAKE these medications        aspirin 81 MG tablet  Take 81 mg by mouth daily.     atorvastatin 20 MG tablet  Commonly known as:  LIPITOR  Take 1 tablet (20  mg total) by mouth daily.     Cetirizine HCl 10 MG Caps  Commonly known as:  ZYRTEC ALLERGY  Take 1 capsule (10 mg total) by mouth daily.     clopidogrel 75 MG tablet  Commonly known as:  PLAVIX  Take 75 mg by mouth daily with breakfast.     isosorbide mononitrate 30 MG 24 hr tablet  Commonly known as:  IMDUR  Take 1 tablet (30 mg total) by mouth daily.     lisinopril-hydrochlorothiazide 20-12.5 MG tablet  Commonly known as:  PRINZIDE,ZESTORETIC  Take 1 tablet by mouth daily.     metFORMIN 500 MG tablet  Commonly known as:  GLUCOPHAGE  Take 1 tablet (500 mg total) by mouth daily with breakfast.     metoprolol tartrate 25 MG tablet  Commonly known as:  LOPRESSOR  Take 0.5 tablets (12.5 mg total) by mouth 2 (two) times daily.     nitroGLYCERIN 0.4 MG SL tablet  Commonly known as:  NITROSTAT  Place 1 tablet (0.4 mg total) under the tongue every 5 (five) minutes as needed for chest pain.     pantoprazole 20 MG tablet  Commonly known as:  PROTONIX  Take 1 tablet (20 mg total) by mouth daily.     sucralfate 1 g tablet  Commonly known as:  CARAFATE  Take 1 tablet (1 g total) by mouth 4 (four) times daily.        Duration of Discharge Encounter   Greater than 30 minutes including physician time.  Ramond DialSigned, Neva Ramaswamy PA-C Pager: [phone] 06/17/2015, 4:27 PM

## 2015-06-17 NOTE — Progress Notes (Signed)
CBG 70, Given a piece of hard candy

## 2015-06-17 NOTE — Interval H&P Note (Signed)
History and Physical Interval Note:  06/17/2015 2:11 PM  Alan Copeland  has presented today for cardiac cath with the diagnosis of CAD, unstable angina  The various methods of treatment have been discussed with the patient and family. After consideration of risks, benefits and other options for treatment, the patient has consented to  Procedure(s): Left Heart Cath and Coronary Angiography (N/A) as a surgical intervention .  The patient's history has been reviewed, patient examined, no change in status, stable for surgery.  I have reviewed the patient's chart and labs.  Questions were answered to the patient's satisfaction.    Cath Lab Visit (complete for each Cath Lab visit)  Clinical Evaluation Leading to the Procedure:   ACS: No.  Non-ACS:    Anginal Classification: CCS III  Anti-ischemic medical therapy: Maximal Therapy (2 or more classes of medications)  Non-Invasive Test Results: No non-invasive testing performed  Prior CABG: No previous CABG        Alan Copeland

## 2015-06-17 NOTE — Progress Notes (Signed)
Discharge teaching and instructions reviewed. No further questions. VSS. Pt discharging home with wife.

## 2015-06-18 LAB — HEMOGLOBIN A1C
Hgb A1c MFr Bld: 6.1 % — ABNORMAL HIGH (ref 4.8–5.6)
Mean Plasma Glucose: 128 mg/dL

## 2015-12-21 DIAGNOSIS — Z955 Presence of coronary angioplasty implant and graft: Secondary | ICD-10-CM | POA: Insufficient documentation

## 2015-12-21 DIAGNOSIS — I252 Old myocardial infarction: Secondary | ICD-10-CM | POA: Insufficient documentation

## 2016-11-27 ENCOUNTER — Observation Stay (HOSPITAL_BASED_OUTPATIENT_CLINIC_OR_DEPARTMENT_OTHER)
Admission: EM | Admit: 2016-11-27 | Discharge: 2016-11-28 | Disposition: A | Discharge status: Home / Self Care | Payer: 59 | Attending: Cardiology | Admitting: Cardiology

## 2016-11-27 ENCOUNTER — Encounter (HOSPITAL_BASED_OUTPATIENT_CLINIC_OR_DEPARTMENT_OTHER): Payer: Self-pay | Admitting: *Deleted

## 2016-11-27 ENCOUNTER — Emergency Department (HOSPITAL_BASED_OUTPATIENT_CLINIC_OR_DEPARTMENT_OTHER): Payer: 59

## 2016-11-27 DIAGNOSIS — E119 Type 2 diabetes mellitus without complications: Secondary | ICD-10-CM | POA: Diagnosis not present

## 2016-11-27 DIAGNOSIS — Z7982 Long term (current) use of aspirin: Secondary | ICD-10-CM | POA: Diagnosis not present

## 2016-11-27 DIAGNOSIS — E7800 Pure hypercholesterolemia, unspecified: Secondary | ICD-10-CM | POA: Insufficient documentation

## 2016-11-27 DIAGNOSIS — E118 Type 2 diabetes mellitus with unspecified complications: Secondary | ICD-10-CM

## 2016-11-27 DIAGNOSIS — I201 Angina pectoris with documented spasm: Secondary | ICD-10-CM

## 2016-11-27 DIAGNOSIS — I252 Old myocardial infarction: Secondary | ICD-10-CM | POA: Diagnosis not present

## 2016-11-27 DIAGNOSIS — M5430 Sciatica, unspecified side: Secondary | ICD-10-CM | POA: Diagnosis not present

## 2016-11-27 DIAGNOSIS — Z955 Presence of coronary angioplasty implant and graft: Secondary | ICD-10-CM | POA: Insufficient documentation

## 2016-11-27 DIAGNOSIS — Z7902 Long term (current) use of antithrombotics/antiplatelets: Secondary | ICD-10-CM | POA: Diagnosis not present

## 2016-11-27 DIAGNOSIS — I259 Chronic ischemic heart disease, unspecified: Secondary | ICD-10-CM | POA: Diagnosis present

## 2016-11-27 DIAGNOSIS — K219 Gastro-esophageal reflux disease without esophagitis: Secondary | ICD-10-CM | POA: Insufficient documentation

## 2016-11-27 DIAGNOSIS — Z7984 Long term (current) use of oral hypoglycemic drugs: Secondary | ICD-10-CM | POA: Insufficient documentation

## 2016-11-27 DIAGNOSIS — Z8249 Family history of ischemic heart disease and other diseases of the circulatory system: Secondary | ICD-10-CM | POA: Diagnosis not present

## 2016-11-27 DIAGNOSIS — I10 Essential (primary) hypertension: Secondary | ICD-10-CM | POA: Diagnosis not present

## 2016-11-27 DIAGNOSIS — I209 Angina pectoris, unspecified: Secondary | ICD-10-CM | POA: Diagnosis present

## 2016-11-27 DIAGNOSIS — I25111 Atherosclerotic heart disease of native coronary artery with angina pectoris with documented spasm: Principal | ICD-10-CM | POA: Insufficient documentation

## 2016-11-27 DIAGNOSIS — I200 Unstable angina: Secondary | ICD-10-CM | POA: Diagnosis not present

## 2016-11-27 DIAGNOSIS — E785 Hyperlipidemia, unspecified: Secondary | ICD-10-CM | POA: Insufficient documentation

## 2016-11-27 DIAGNOSIS — I249 Acute ischemic heart disease, unspecified: Secondary | ICD-10-CM | POA: Diagnosis present

## 2016-11-27 DIAGNOSIS — Z87891 Personal history of nicotine dependence: Secondary | ICD-10-CM | POA: Insufficient documentation

## 2016-11-27 HISTORY — DX: Angina pectoris with documented spasm: I20.1

## 2016-11-27 LAB — BASIC METABOLIC PANEL
Anion gap: 10 (ref 5–15)
BUN: 14 mg/dL (ref 6–20)
CALCIUM: 9.3 mg/dL (ref 8.9–10.3)
CO2: 23 mmol/L (ref 22–32)
Chloride: 102 mmol/L (ref 101–111)
Creatinine, Ser: 1.15 mg/dL (ref 0.61–1.24)
GFR calc Af Amer: >60 mL/min (ref >60)
GLUCOSE: 99 mg/dL (ref 65–99)
POTASSIUM: 3.7 mmol/L (ref 3.5–5.1)
SODIUM: 135 mmol/L (ref 135–145)

## 2016-11-27 LAB — HEPATIC FUNCTION PANEL
ALBUMIN: 4.5 g/dL (ref 3.5–5.0)
ALT: 16 U/L — ABNORMAL LOW (ref 17–63)
AST: 18 U/L (ref 15–41)
Alkaline Phosphatase: 53 U/L (ref 38–126)
Bilirubin, Direct: <0.1 mg/dL — ABNORMAL LOW (ref 0.1–0.5)
TOTAL PROTEIN: 8.4 g/dL — AB (ref 6.5–8.1)
Total Bilirubin: 0.7 mg/dL (ref 0.3–1.2)

## 2016-11-27 LAB — CBC
HEMATOCRIT: 42.7 % (ref 39.0–52.0)
Hemoglobin: 14.7 g/dL (ref 13.0–17.0)
MCH: 22.3 pg — ABNORMAL LOW (ref 26.0–34.0)
MCHC: 34.4 g/dL (ref 30.0–36.0)
MCV: 64.7 fL — ABNORMAL LOW (ref 78.0–100.0)
PLATELETS: 346 10*3/uL (ref 150–400)
RBC: 6.6 MIL/uL — ABNORMAL HIGH (ref 4.22–5.81)
RDW: 19.5 % — AB (ref 11.5–15.5)
WBC: 8.1 10*3/uL (ref 4.0–10.5)

## 2016-11-27 LAB — TROPONIN I: Troponin I: <0.03 ng/mL (ref <0.03)

## 2016-11-27 LAB — MRSA PCR SCREENING: MRSA BY PCR: NEGATIVE

## 2016-11-27 LAB — GLUCOSE, CAPILLARY: Glucose-Capillary: 169 mg/dL — ABNORMAL HIGH (ref 65–99)

## 2016-11-27 MED ORDER — LISINOPRIL 20 MG PO TABS
20.0000 mg | ORAL_TABLET | Freq: Every day | ORAL | Status: DC
  Administered 2016-11-28: 20 mg via ORAL
  Filled 2016-11-27: qty 1

## 2016-11-27 MED ORDER — CLOPIDOGREL BISULFATE 75 MG PO TABS
75.0000 mg | ORAL_TABLET | Freq: Once | ORAL | Status: AC
  Administered 2016-11-27: 75 mg via ORAL
  Filled 2016-11-27: qty 1

## 2016-11-27 MED ORDER — PANTOPRAZOLE SODIUM 20 MG PO TBEC
20.0000 mg | DELAYED_RELEASE_TABLET | Freq: Every day | ORAL | Status: DC
  Administered 2016-11-28: 20 mg via ORAL
  Filled 2016-11-27: qty 1

## 2016-11-27 MED ORDER — HEPARIN (PORCINE) IN NACL 100-0.45 UNIT/ML-% IJ SOLN
1100.0000 [IU]/h | INTRAMUSCULAR | Status: DC
  Administered 2016-11-27: 1150 [IU]/h via INTRAVENOUS
  Filled 2016-11-27: qty 250

## 2016-11-27 MED ORDER — CLOPIDOGREL BISULFATE 75 MG PO TABS
75.0000 mg | ORAL_TABLET | Freq: Every day | ORAL | Status: DC
  Administered 2016-11-28: 75 mg via ORAL
  Filled 2016-11-27: qty 1

## 2016-11-27 MED ORDER — LISINOPRIL-HYDROCHLOROTHIAZIDE 20-12.5 MG PO TABS: 1.0000 | ORAL_TABLET | Freq: Every day | ORAL | Status: DC

## 2016-11-27 MED ORDER — METOPROLOL TARTRATE 12.5 MG HALF TABLET
12.5000 mg | ORAL_TABLET | Freq: Two times a day (BID) | ORAL | Status: DC
  Administered 2016-11-27 – 2016-11-28 (×2): 12.5 mg via ORAL
  Filled 2016-11-27 (×2): qty 1

## 2016-11-27 MED ORDER — ATORVASTATIN CALCIUM 80 MG PO TABS
80.0000 mg | ORAL_TABLET | Freq: Every day | ORAL | Status: DC
  Administered 2016-11-27: 80 mg via ORAL
  Filled 2016-11-27: qty 1

## 2016-11-27 MED ORDER — ACETAMINOPHEN 325 MG PO TABS: 650.0000 mg | ORAL_TABLET | ORAL | Status: DC | PRN

## 2016-11-27 MED ORDER — MORPHINE SULFATE (PF) 2 MG/ML IV SOLN: 2.0000 mg | INTRAVENOUS | Status: DC | PRN

## 2016-11-27 MED ORDER — ONDANSETRON HCL 4 MG/2ML IJ SOLN: 4.0000 mg | Freq: Four times a day (QID) | INTRAMUSCULAR | Status: DC | PRN

## 2016-11-27 MED ORDER — ISOSORBIDE MONONITRATE ER 30 MG PO TB24
30.0000 mg | ORAL_TABLET | Freq: Every day | ORAL | Status: DC
  Administered 2016-11-28: 30 mg via ORAL
  Filled 2016-11-27: qty 1

## 2016-11-27 MED ORDER — HYDROCHLOROTHIAZIDE 12.5 MG PO CAPS: 12.5000 mg | ORAL_CAPSULE | Freq: Every day | ORAL | Status: DC

## 2016-11-27 MED ORDER — INSULIN ASPART 100 UNIT/ML ~~LOC~~ SOLN: 0.0000 [IU] | Freq: Three times a day (TID) | SUBCUTANEOUS | Status: DC

## 2016-11-27 MED ORDER — ASPIRIN 81 MG PO CHEW
81.0000 mg | CHEWABLE_TABLET | Freq: Every day | ORAL | Status: DC
  Administered 2016-11-28: 81 mg via ORAL
  Filled 2016-11-27: qty 1

## 2016-11-27 MED ORDER — NITROGLYCERIN 0.4 MG SL SUBL: 0.4000 mg | SUBLINGUAL_TABLET | SUBLINGUAL | Status: DC | PRN

## 2016-11-27 MED ORDER — HEPARIN BOLUS VIA INFUSION
4000.0000 [IU] | Freq: Once | INTRAVENOUS | Status: AC
  Administered 2016-11-27: 4000 [IU] via INTRAVENOUS

## 2016-11-27 MED ORDER — ASPIRIN 81 MG PO CHEW
243.0000 mg | CHEWABLE_TABLET | Freq: Once | ORAL | Status: AC
  Administered 2016-11-27: 243 mg via ORAL
  Filled 2016-11-27: qty 3

## 2016-11-27 MED ORDER — INSULIN ASPART 100 UNIT/ML ~~LOC~~ SOLN: 0.0000 [IU] | Freq: Every day | SUBCUTANEOUS | Status: DC

## 2016-11-27 NOTE — ED Provider Notes (Signed)
[facility] Provider Note   CSN: [id number] Arrival date & time: 11/27/16  1326     History   Chief Complaint Chief Complaint  Patient presents with  . Chest Pain    HPI Alan Copeland is a 51 y.o. male.  HPI   Alan Copeland is a 51 y.o. male, with a history of MI, presenting to the ED with chest pain that began this morning while fixing breakfast around 6:30AM. Would rate it 6/10, retrosternal, sharp/burning, nonradiating. Endorses some lightheadedness along with the CP. Took 81mg  ASA and laid down. Pain lasted for about 10 minutes, resolved, and has not recurred.   Endorses intermittent left lower jaw pain intermittently for the past 2 days. States it is not consistently reproducible. States he was seen in 2013 for a MI. Came in for heart burn and jaw pain. He states today's pain is similar to his pain then.   States his left arm pain is intermittent for the past 3 days. Extends from humerus down into 3rd-5th digits. Intermittent without noted pattern. This is reproducible with movement at the elbow.  Patient denies SOB, N/V/D, cough, fever/chills, abdominal pain, dizziness, diaphoresis, or any other complaints.     Past Medical History:  Diagnosis Date  . Anginal pain (HCC)   . CAD (coronary artery disease) 01/25/2012   a. RCA ptca/des 2013 with NSTEMI b. restenosis of RCA in 07/2013 w/ DES placed c. cath 06/17/2014 negative cath, patent stent in RCA with minimal restenosis  . Carotid bruit   . High cholesterol   . Hypertension   . Sciatica   . Sinus headache    "weekly" (07/24/2013)  . Type II diabetes mellitus (HCC) dx'd 2013    Patient Active Problem List   Diagnosis Date Noted  . Precordial pain   . Unstable angina (HCC) 06/16/2015  . DM type 2 (diabetes mellitus, type 2) (HCC) 06/16/2015  . HLD (hyperlipidemia) 06/16/2015  . CAD (coronary artery disease) 07/24/2013  . Chest pain 07/20/2013  . Impotence 06/18/2012  . MI, acute, non ST  segment elevation (HCC) 01/25/2012  . Hypertension 01/25/2012  . Tobacco use 01/25/2012  . GERD (gastroesophageal reflux disease) 01/25/2012    Past Surgical History:  Procedure Laterality Date  . CARDIAC CATHETERIZATION  07/24/2013  . CARDIAC CATHETERIZATION N/A 06/17/2015   Procedure: Left Heart Cath and Coronary Angiography;  Surgeon: Kathleene Hazel, MD;  Location: [facility] INVASIVE CV LAB;  Service: Cardiovascular;  Laterality: N/A;  . CORONARY ANGIOPLASTY WITH STENT PLACEMENT  01/2012   "1"  . INGUINAL HERNIA REPAIR  ~ 1985   left  . LEFT HEART CATHETERIZATION WITH CORONARY ANGIOGRAM N/A 01/26/2012   Procedure: LEFT HEART CATHETERIZATION WITH CORONARY ANGIOGRAM;  Surgeon: Lesleigh Noe, MD;  Location: [facility] CATH LAB;  Service: Cardiovascular;  Laterality: N/A;  . LEFT HEART CATHETERIZATION WITH CORONARY ANGIOGRAM N/A 07/24/2013   Procedure: LEFT HEART CATHETERIZATION WITH CORONARY ANGIOGRAM;  Surgeon: Runell Gess, MD;  Location: [facility] CATH LAB;  Service: Cardiovascular;  Laterality: N/A;  . PERCUTANEOUS CORONARY STENT INTERVENTION (PCI-S) N/A 07/27/2013   Procedure: PERCUTANEOUS CORONARY STENT INTERVENTION (PCI-S);  Surgeon: Micheline Chapman, MD;  Location: [facility] CATH LAB;  Service: Cardiovascular;  Laterality: N/A;       Home Medications    Prior to Admission medications   Medication Sig Start Date End Date Taking? Authorizing Provider  aspirin 81 MG tablet Take 81 mg by mouth daily.   Yes [provider]  atorvastatin (  LIPITOR) 20 MG tablet Take 1 tablet (20 mg total) by mouth daily. 06/17/15  Yes Azalee CourseMeng, Hao, PA  clopidogrel (PLAVIX) 75 MG tablet Take 75 mg by mouth daily with breakfast.   Yes [provider]  isosorbide mononitrate (IMDUR) 30 MG 24 hr tablet Take 1 tablet (30 mg total) by mouth daily. 06/17/15  Yes Azalee CourseMeng, Hao, PA  lisinopril-hydrochlorothiazide (PRINZIDE,ZESTORETIC) 20-12.5 MG tablet Take 1 tablet by mouth daily. 06/17/15  Yes Azalee CourseMeng, Hao, PA  metFORMIN  (GLUCOPHAGE) 500 MG tablet Take 1 tablet (500 mg total) by mouth daily with breakfast. 06/17/15  Yes Azalee CourseMeng, Hao, PA  metoprolol tartrate (LOPRESSOR) 25 MG tablet Take 0.5 tablets (12.5 mg total) by mouth 2 (two) times daily. 06/17/15  Yes Azalee CourseMeng, Hao, PA  nitroGLYCERIN (NITROSTAT) 0.4 MG SL tablet Place 1 tablet (0.4 mg total) under the tongue every 5 (five) minutes as needed for chest pain. 06/17/15  Yes Azalee CourseMeng, Hao, PA  pantoprazole (PROTONIX) 20 MG tablet Take 1 tablet (20 mg total) by mouth daily. 06/17/15  Yes Azalee CourseMeng, Hao, PA  Cetirizine HCl (ZYRTEC ALLERGY) 10 MG CAPS Take 1 capsule (10 mg total) by mouth daily. 02/02/14   Rolland PorterJames, Mark, MD  sucralfate (CARAFATE) 1 G tablet Take 1 tablet (1 g total) by mouth 4 (four) times daily. 02/02/14   Rolland PorterJames, Mark, MD    Family History Family History  Problem Relation Age of Onset  . Hypertension Mother   . Diabetes Mother   . Hypertension Father   . Diabetes Father   . Hypertension Brother     Social History Social History  Substance Use Topics  . Smoking status: Former Smoker    Packs/day: 0.50    Years: 29.00    Types: Cigarettes    Quit date: 06/11/2013  . Smokeless tobacco: Never Used  . Alcohol use 0.6 oz/week    1 Cans of beer per week     Comment: 1 can of beer every 1-2 weeks     Allergies   Patient has no known allergies.   Review of Systems Review of Systems  Constitutional: Negative for chills, diaphoresis and fever.  Respiratory: Negative for shortness of breath.   Cardiovascular: Positive for chest pain (resolved).  Gastrointestinal: Negative for abdominal pain, diarrhea, nausea and vomiting.  Musculoskeletal: Positive for myalgias. Negative for back pain.  Neurological: Negative for dizziness, syncope, weakness, light-headedness, numbness and headaches.  All other systems reviewed and are negative.    Physical Exam Updated Vital Signs BP (!) 155/85 (BP Location: Right Arm)   Pulse 78   Temp 98.3 F (36.8 C) (Oral)   Resp 18    Ht 6\' 2"  (1.88 m)   Wt 95.3 kg (210 lb)   SpO2 99%   BMI 26.96 kg/m   Physical Exam  Constitutional: He appears well-developed and well-nourished. No distress.  HENT:  Head: Normocephalic and atraumatic.  No noted intraoral or dentition abnormalities. Patient can fully open his jaw.  Eyes: Conjunctivae are normal.  Neck: Normal range of motion. Neck supple.  Cardiovascular: Normal rate, regular rhythm, normal heart sounds and intact distal pulses.   Pulmonary/Chest: Effort normal and breath sounds normal. No respiratory distress. He exhibits no tenderness.  Abdominal: Soft. There is no tenderness. There is no guarding.  Musculoskeletal: He exhibits tenderness. He exhibits no edema or deformity.  Tenderness to left lateral elbow. Positive Tinel sign. Full range of motion in the left shoulder, elbow, wrist, and hand.  Lymphadenopathy:    He has no  cervical adenopathy.  Neurological: He is alert.  No sensory deficits in the bilateral upper extremities. Strength 5/5 bilaterally.  Skin: Skin is warm and dry. He is not diaphoretic.  Psychiatric: He has a normal mood and affect. His behavior is normal.  Nursing note and vitals reviewed.    ED Treatments / Results  Labs (all labs ordered are listed, but only abnormal results are displayed) Labs Reviewed  CBC - Abnormal; Notable for the following:       Result Value   RBC 6.60 (*)    MCV 64.7 (*)    MCH 22.3 (*)    RDW 19.5 (*)    All other components within normal limits  HEPATIC FUNCTION PANEL - Abnormal; Notable for the following:    Total Protein 8.4 (*)    ALT 16 (*)    Bilirubin, Direct <0.1 (*)    All other components within normal limits  BASIC METABOLIC PANEL  TROPONIN I  TROPONIN I  HEPARIN LEVEL (UNFRACTIONATED)    EKG  EKG Interpretation  Date/Time:  Tuesday November 27 2016 13:32:29 EDT Ventricular Rate:  87 PR Interval:  146 QRS Duration: 84 QT Interval:  370 QTC Calculation: 445 R Axis:   62 Text  Interpretation:  Normal sinus rhythm T wave abnormality, consider inferior ischemia Abnormal ECG T wave flattening in 2, 3, ad avF Confirmed by Bary Castilla ([id number]) on 11/27/2016 5:30:51 PM       Radiology Dg Chest 2 View  Result Date: 11/27/2016 CLINICAL DATA:  Chest pain. EXAM: CHEST  2 VIEW COMPARISON:  Radiographs of August 18, 2012. FINDINGS: The heart size and mediastinal contours are within normal limits. Both lungs are clear. No pneumothorax or pleural effusion is noted. The visualized skeletal structures are unremarkable. IMPRESSION: No active cardiopulmonary disease. Electronically Signed   By: Lupita Raider, M.D.   On: 11/27/2016 14:27    Procedures Procedures (including critical care time)  CRITICAL CARE Performed by: Ercil Cassis C Tiny Chaudhary Total critical care time: 45 minutes Critical care time was exclusive of separately billable procedures and treating other patients. Critical care was necessary to treat or prevent imminent or life-threatening deterioration. Critical care was time spent personally by me on the following activities: development of treatment plan with patient and/or surrogate as well as nursing, discussions with consultants, evaluation of patient's response to treatment, examination of patient, obtaining history from patient or surrogate, ordering and performing treatments and interventions, ordering and review of laboratory studies, ordering and review of radiographic studies, pulse oximetry and re-evaluation of patient's condition.  Medications Ordered in ED Medications  heparin bolus via infusion 4,000 Units (not administered)  heparin ADULT infusion 100 units/mL (25000 units/257mL sodium chloride 0.45%) (not administered)  aspirin chewable tablet 243 mg (243 mg Oral Given 11/27/16 1548)     Initial Impression / Assessment and Plan / ED Course  I have reviewed the triage vital signs and the nursing notes.  Pertinent labs & imaging results that were available  during my care of the patient were reviewed by me and considered in my medical decision making (see chart for details).  Clinical Course as of Nov 27 1824  Tue Nov 27, 2016  1555 Had a shared decision-making session with the patient regarding admission. I recommended to the patient that we admit him for observation and further work up. Patient listened to the options and voiced understanding. He states he does not want to be admitted, despite my recommendation. He states he understands the risks  associated with this.  [SJ]  1745 This EKG shows dynamic changes especially in II, III, aVF as well aVL and V2. These were discussed with the patient. Using this new evidence, I again discussed the need for admission and further work up with the patient. He still voices hesitation. He states he will discuss it with his significant other at the bedside. Repeat EKG [SJ]  1756 Spoke with Dr. Jens Som, cardiology, who agrees with definite transfer to [facility] and admission to step down. Recommends starting heparin for unstable angina.  [SJ]  1801 Spoke with the patient again and made him aware of the cardiologist's recommendations. He agrees to allow transfer and admission.  [SJ]    Clinical Course User Index [SJ] Tatayana Beshears C, PA-C    Patient presents with complaint of chest pain and jaw pain. His chest pain resolved this morning and has not recurred, however, his story is concerning based on his history. Patient was very resistant admission throughout his ED course until the end. Patient vital signs were stable and patient remained symptom free throughout ED course.  Findings and plan of care discussed with Courteney Mackuen. Dr. Corlis Leak personally evaluated and examined this patient.  Vitals:   11/27/16 1430 11/27/16 1544 11/27/16 1600 11/27/16 1630  BP: 121/80 125/81 133/84 125/88  Pulse: 70 63 64 69  Resp: 13 14 16 17   Temp:      TempSrc:      SpO2: 98% 99% 100% 100%  Weight:      Height:          Final Clinical Impressions(s) / ED Diagnoses   Final diagnoses:  Unstable angina pectoris [facility])    New Prescriptions New Prescriptions   No medications on file     Concepcion Living 11/27/16 1830    Abelino Derrick, MD 11/27/16 2338

## 2016-11-27 NOTE — ED Notes (Signed)
Patient transported to X-ray 

## 2016-11-27 NOTE — Progress Notes (Signed)
ANTICOAGULATION CONSULT NOTE - Initial Consult  Pharmacy Consult for heparin Indication: chest pain/ACS  No Known Allergies  Patient Measurements: Height: 6\' 2"  (188 cm) Weight: 210 lb (95.3 kg) IBW/kg (Calculated) : 82.2 Heparin Dosing Weight: 95.3 kg  Vital Signs: Temp: 98.3 F (36.8 C) (06/19 1334) Temp Source: Oral (06/19 1334) BP: 125/88 (06/19 1630) Pulse Rate: 69 (06/19 1630)   Recent Labs  11/27/16 1358 11/27/16 1400 11/27/16 1644  HGB  --  14.7  --   HCT  --  42.7  --   PLT  --  346  --   CREATININE  --  1.15  --   TROPONINI <0.03  --  <0.03    Medical History: Past Medical History:  Diagnosis Date  . Anginal pain (HCC)   . CAD (coronary artery disease) 01/25/2012   a. RCA ptca/des 2013 with NSTEMI b. restenosis of RCA in 07/2013 w/ DES placed c. cath 06/17/2014 negative cath, patent stent in RCA with minimal restenosis  . Carotid bruit   . High cholesterol   . Hypertension   . Sciatica   . Sinus headache    "weekly" (07/24/2013)  . Type II diabetes mellitus (HCC) dx'd 2013    Assessment: Admitted with chest pain and intermittent arm pain over the past 3 days. Planning to start heparin drip for rule out ACS. Pt is not taking any anticoagulation prior to admission, basline cbc wnl, the first two tropnins are negative.   Goal of Therapy:  Heparin level 0.3-0.7 units/ml Monitor platelets by anticoagulation protocol: Yes     Plan:  -Heparin bolus 4000 units x1 then 1150 units/hr -Daily HL, CBC -First level this evening   Baldemar FridayMasters, Sandralee Tarkington M 11/27/2016,6:14 PM

## 2016-11-27 NOTE — ED Notes (Signed)
Verified heparin with Fenton Foyiane Merritt, RN

## 2016-11-27 NOTE — H&P (Signed)
CARDIOLOGY H&P  HPI: 51 yo male with a pmhx of CAD (s/p DES to RCA for NSTEMI in 2013 and again in 2015 for ISR), HTN, HLD, DM2, and GERD who presents to our facility as a transfer from [facility] for evaluation of chest pain.  Pt sates that he was cooking breakfast early this morning prior to going to work when he had sudden onset of midsternal chest pain that he describes as a moderate, 6/10, burning sensation.  He stopped cooking, took an ASA, and went to lay down, which resulted in improvement of his symptoms after several minutes.  He denies SOB, palpitations, nausea, vomiting, orthopnea, LE edema, or syncope, though he did feel lightheaded and had some left arm tingling during the event.  Also notes that he has been struggling with GERD recently and as an UGI endoscopy scheduled in the near future, though he does not think that this felt like his normal heartburn.  He also has noted some left sided jaw pain on and off over the past several days, a symptom he experienced prior to his last AMI.  Pt has been compliant with his medications and has not smoked since his AMI in 2015.  He presented to the outside ED where his chest pain had resolved, vitals were stable, and initial troponin was negative.  EKG there showed some dynamic ST-T wave changes in the inferior leads, thus cardiology recommended that he be transferred to [facility] and started on a heparin infusion for unstable angina.  At the time of my exam, pt remained CP free and HD stable.     Review of Systems:     Cardiac Review of Systems: {Y] = yes [ ]  = no  Chest Pain [ Y   ]  Resting SOB [   ] Exertional SOB  [  ]  Orthopnea [  ]   Pedal Edema [   ]    Palpitations [  ] Syncope  [  ]   Presyncope [   ]  General Review of Systems: [Y] = yes [  ]=no Constitional: recent weight change [  ]; anorexia [  ]; fatigue [  ]; nausea [  ]; night sweats [  ]; fever [  ]; or chills [  ];                                                                      Dental: poor dentition[  ];   Eye : blurred vision [  ]; diplopia [   ]; vision changes [  ];  Amaurosis fugax[  ]; Resp: cough [  ];  wheezing[  ];  hemoptysis[  ]; shortness of breath[  ]; paroxysmal nocturnal dyspnea[  ]; dyspnea on exertion[  ]; or orthopnea[  ];  GI:  gallstones[  ], vomiting[  ];  dysphagia[  ]; melena[  ];  hematochezia [  ]; heartburn[ Y ];   GU: kidney stones [  ]; hematuria[  ];   dysuria [  ];  nocturia[  ];               Skin: rash [  ], swelling[  ];, hair loss[  ];  peripheral edema[  ];  or  itching[  ]; Musculosketetal: myalgias[  ];  joint swelling[  ];  joint erythema[  ];  joint pain[  ];  back pain[  ];  Heme/Lymph: bruising[  ];  bleeding[  ];  anemia[  ];  Neuro: TIA[  ];  headaches[  ];  stroke[  ];  vertigo[  ];  seizures[  ];   paresthesias[  ];  difficulty walking[  ];  Psych:depression[  ]; anxiety[  ];  Endocrine: diabetes[  ];  thyroid dysfunction[  ];  Other:  Past Medical History:  Diagnosis Date  . Anginal pain (HCC)   . CAD (coronary artery disease) 01/25/2012   a. RCA ptca/des 2013 with NSTEMI b. restenosis of RCA in 07/2013 w/ DES placed c. cath 06/17/2014 negative cath, patent stent in RCA with minimal restenosis  . Carotid bruit   . High cholesterol   . Hypertension   . Sciatica   . Sinus headache    "weekly" (07/24/2013)  . Type II diabetes mellitus (HCC) dx'd 2013    No Known Allergies  Social History   Social History  . Marital status: Married    Spouse name: N/A  . Number of children: N/A  . Years of education: N/A   Occupational History  . Not on file.   Social History Main Topics  . Smoking status: Former Smoker    Packs/day: 0.50    Years: 29.00    Types: Cigarettes    Quit date: 06/11/2013  . Smokeless tobacco: Never Used  . Alcohol use 0.6 oz/week    1 Cans of beer per week     Comment: 1 can of beer every 1-2 weeks  . Drug use: No     Comment: Last used in 2013  . Sexual activity: Yes   Other Topics  Concern  . Not on file   Social History Narrative  . No narrative on file    Family History  Problem Relation Age of Onset  . Hypertension Mother   . Diabetes Mother   . Hypertension Father   . Diabetes Father   . Hypertension Brother     PHYSICAL EXAM: Vitals:   11/27/16 1930 11/27/16 2058  BP: 123/72 137/77  Pulse: 72 63  Resp: 16 (!) 22  Temp:  98 F (36.7 C)   General:  Well appearing. No respiratory difficulty HEENT: normal Neck: supple. no JVD. Carotids 2+ bilat; no bruits. No lymphadenopathy or thryomegaly appreciated. Cor: PMI nondisplaced. Regular rate & rhythm. No rubs, gallops or murmurs. Lungs: clear Abdomen: soft, nontender, nondistended. No hepatosplenomegaly. No bruits or masses. Good bowel sounds. Extremities: no cyanosis, clubbing, rash, edema Neuro: alert & oriented x 3, cranial nerves grossly intact. moves all 4 extremities w/o difficulty. Affect pleasant.  ECG: NSR, no acute ST-T wave abn ECG at outside ED: NSR with inferior T wave abn. Concerning for ischemia   Results for orders placed or performed during the hospital encounter of 11/27/16 (from the past 24 hour(s))  Troponin I     Status: None   Collection Time: 11/27/16  1:58 PM  Result Value Ref Range   Troponin I <0.03 <0.03 ng/mL  Hepatic function panel     Status: Abnormal   Collection Time: 11/27/16  1:58 PM  Result Value Ref Range   Total Protein 8.4 (H) 6.5 - 8.1 g/dL   Albumin 4.5 3.5 - 5.0 g/dL   AST 18 15 - 41 U/L   ALT 16 (L) 17 - 63 U/L  Alkaline Phosphatase 53 38 - 126 U/L   Total Bilirubin 0.7 0.3 - 1.2 mg/dL   Bilirubin, Direct <1.3<0.1 (L) 0.1 - 0.5 mg/dL   Indirect Bilirubin NOT CALCULATED 0.3 - 0.9 mg/dL  Basic metabolic panel     Status: None   Collection Time: 11/27/16  2:00 PM  Result Value Ref Range   Sodium 135 135 - 145 mmol/L   Potassium 3.7 3.5 - 5.1 mmol/L   Chloride 102 101 - 111 mmol/L   CO2 23 22 - 32 mmol/L   Glucose, Bld 99 65 - 99 mg/dL   BUN 14 6 - 20  mg/dL   Creatinine, Ser 2.441.15 0.61 - 1.24 mg/dL   Calcium 9.3 8.9 - 01.010.3 mg/dL   GFR calc non Af Amer >60 >60 mL/min   GFR calc Af Amer >60 >60 mL/min   Anion gap 10 5 - 15  CBC     Status: Abnormal   Collection Time: 11/27/16  2:00 PM  Result Value Ref Range   WBC 8.1 4.0 - 10.5 K/uL   RBC 6.60 (H) 4.22 - 5.81 MIL/uL   Hemoglobin 14.7 13.0 - 17.0 g/dL   HCT 27.242.7 53.639.0 - 64.452.0 %   MCV 64.7 (L) 78.0 - 100.0 fL   MCH 22.3 (L) 26.0 - 34.0 pg   MCHC 34.4 30.0 - 36.0 g/dL   RDW 03.419.5 (H) 74.211.5 - 59.515.5 %   Platelets 346 150 - 400 K/uL  Troponin I     Status: None   Collection Time: 11/27/16  4:44 PM  Result Value Ref Range   Troponin I <0.03 <0.03 ng/mL   Dg Chest 2 View  Result Date: 11/27/2016 CLINICAL DATA:  Chest pain. EXAM: CHEST  2 VIEW COMPARISON:  Radiographs of August 18, 2012. FINDINGS: The heart size and mediastinal contours are within normal limits. Both lungs are clear. No pneumothorax or pleural effusion is noted. The visualized skeletal structures are unremarkable. IMPRESSION: No active cardiopulmonary disease. Electronically Signed   By: Lupita RaiderJames  Green Jr, M.D.   On: 11/27/2016 14:27     ASSESSMENT: 51 yo male with a hx of DES x 2 to the RCA, HTN, HLD, DM2, and GERD who presents as a transfer for evaluation of unstable angina.  He is currently chest pain free and HD stable on a heparin infusion.  Troponin negative x 2 at outside facility.  Repeat EKG in our facility shows resolution of inferior T wave abnormality seen in the outside ED.    PLAN/DISCUSSION: 1. Unstable Angina -Admit to telemetry floor  -Trend troponin, serial EKG -Continue heparin infusion -Already loaded with ASA, continue 81mg  daily -Takes plavix daily, but missed this AM dose.  Will give 75mg  now and start on 75mg  daily -IV morphine and SL NTG PRN for chest pain -TTE in AM -NPO at midnight, consider LHC in AM -increase lipitor to 80mg  daily -TSH, HbA1c, and Lipids  2. HTN -Continue home BB, ACEI,  HCTZ  3. DM2 -Hold metformin, cover with ISS and accuchecks  4. GERD -PTA PPI  Levonne SpillerMatthew D Roby, DO 9:34 PM

## 2016-11-27 NOTE — ED Triage Notes (Signed)
Chest pain this am sharp and burning sensation. His left arm and jaw have been hurting x 3 days.

## 2016-11-28 ENCOUNTER — Ambulatory Visit (HOSPITAL_COMMUNITY)
Admission: EM | Disposition: A | Discharge status: Home / Self Care | Payer: Self-pay | Source: Home / Self Care | Attending: Physician Assistant

## 2016-11-28 ENCOUNTER — Encounter (HOSPITAL_COMMUNITY): Payer: Self-pay | Admitting: Cardiology

## 2016-11-28 DIAGNOSIS — I252 Old myocardial infarction: Secondary | ICD-10-CM | POA: Diagnosis not present

## 2016-11-28 DIAGNOSIS — I200 Unstable angina: Secondary | ICD-10-CM | POA: Diagnosis not present

## 2016-11-28 DIAGNOSIS — Z955 Presence of coronary angioplasty implant and graft: Secondary | ICD-10-CM | POA: Diagnosis not present

## 2016-11-28 DIAGNOSIS — I201 Angina pectoris with documented spasm: Secondary | ICD-10-CM | POA: Diagnosis not present

## 2016-11-28 DIAGNOSIS — I10 Essential (primary) hypertension: Secondary | ICD-10-CM | POA: Diagnosis not present

## 2016-11-28 DIAGNOSIS — R072 Precordial pain: Secondary | ICD-10-CM

## 2016-11-28 DIAGNOSIS — I249 Acute ischemic heart disease, unspecified: Secondary | ICD-10-CM

## 2016-11-28 DIAGNOSIS — I25111 Atherosclerotic heart disease of native coronary artery with angina pectoris with documented spasm: Secondary | ICD-10-CM | POA: Diagnosis not present

## 2016-11-28 DIAGNOSIS — E785 Hyperlipidemia, unspecified: Secondary | ICD-10-CM | POA: Diagnosis not present

## 2016-11-28 HISTORY — PX: LEFT HEART CATH AND CORONARY ANGIOGRAPHY: CATH118249

## 2016-11-28 HISTORY — DX: Angina pectoris with documented spasm: I20.1

## 2016-11-28 LAB — PROTIME-INR
INR: 1.04
Prothrombin Time: 13.6 seconds (ref 11.4–15.2)

## 2016-11-28 LAB — BASIC METABOLIC PANEL
Anion gap: 6 (ref 5–15)
BUN: 11 mg/dL (ref 6–20)
CHLORIDE: 104 mmol/L (ref 101–111)
CO2: 26 mmol/L (ref 22–32)
Calcium: 8.9 mg/dL (ref 8.9–10.3)
Creatinine, Ser: 1.1 mg/dL (ref 0.61–1.24)
GFR calc non Af Amer: >60 mL/min (ref >60)
GLUCOSE: 69 mg/dL (ref 65–99)
POTASSIUM: 3.8 mmol/L (ref 3.5–5.1)
Sodium: 136 mmol/L (ref 135–145)

## 2016-11-28 LAB — GLUCOSE, CAPILLARY
GLUCOSE-CAPILLARY: 99 mg/dL (ref 65–99)
Glucose-Capillary: 82 mg/dL (ref 65–99)
Glucose-Capillary: 114 mg/dL — ABNORMAL HIGH (ref 65–99)

## 2016-11-28 LAB — TROPONIN I
Troponin I: <0.03 ng/mL (ref <0.03)
Troponin I: <0.03 ng/mL (ref <0.03)

## 2016-11-28 LAB — LIPID PANEL
Cholesterol: 135 mg/dL (ref 0–200)
HDL: 42 mg/dL (ref >40)
LDL CALC: 84 mg/dL (ref 0–99)
TRIGLYCERIDES: 43 mg/dL (ref <150)
Total CHOL/HDL Ratio: 3.2 RATIO
VLDL: 9 mg/dL (ref 0–40)

## 2016-11-28 LAB — TSH: TSH: 4.387 u[IU]/mL (ref 0.350–4.500)

## 2016-11-28 LAB — HEPARIN LEVEL (UNFRACTIONATED)
HEPARIN UNFRACTIONATED: 0.7 [IU]/mL (ref 0.30–0.70)
Heparin Unfractionated: 0.49 IU/mL (ref 0.30–0.70)

## 2016-11-28 LAB — HIV ANTIBODY (ROUTINE TESTING W REFLEX): HIV Screen 4th Generation wRfx: NONREACTIVE

## 2016-11-28 SURGERY — LEFT HEART CATH AND CORONARY ANGIOGRAPHY
Anesthesia: LOCAL

## 2016-11-28 MED ORDER — IOPAMIDOL (ISOVUE-370) INJECTION 76%
INTRAVENOUS | Status: AC
  Filled 2016-11-28: qty 100

## 2016-11-28 MED ORDER — HEPARIN SODIUM (PORCINE) 1000 UNIT/ML IJ SOLN
INTRAMUSCULAR | Status: AC
  Filled 2016-11-28: qty 1

## 2016-11-28 MED ORDER — HEPARIN (PORCINE) IN NACL 2-0.9 UNIT/ML-% IJ SOLN
INTRAMUSCULAR | Status: DC | PRN
  Administered 2016-11-28: 11:00:00

## 2016-11-28 MED ORDER — LIDOCAINE HCL 1 % IJ SOLN
INTRAMUSCULAR | Status: AC
  Filled 2016-11-28: qty 20

## 2016-11-28 MED ORDER — VERAPAMIL HCL 2.5 MG/ML IV SOLN
INTRAVENOUS | Status: AC
  Filled 2016-11-28: qty 2

## 2016-11-28 MED ORDER — SODIUM CHLORIDE 0.9 % IV SOLN: INTRAVENOUS | Status: AC

## 2016-11-28 MED ORDER — SODIUM CHLORIDE 0.9% FLUSH: 3.0000 mL | Freq: Two times a day (BID) | INTRAVENOUS | Status: DC

## 2016-11-28 MED ORDER — ATORVASTATIN CALCIUM 80 MG PO TABS: 80.0000 mg | ORAL_TABLET | Freq: Every day | ORAL | Status: DC

## 2016-11-28 MED ORDER — IOPAMIDOL (ISOVUE-370) INJECTION 76%
INTRAVENOUS | Status: AC
Start: 2016-11-28 — End: ?
  Filled 2016-11-28: qty 50

## 2016-11-28 MED ORDER — SODIUM CHLORIDE 0.9% FLUSH: 3.0000 mL | INTRAVENOUS | Status: DC | PRN

## 2016-11-28 MED ORDER — MIDAZOLAM HCL 2 MG/2ML IJ SOLN
INTRAMUSCULAR | Status: AC
  Filled 2016-11-28: qty 2

## 2016-11-28 MED ORDER — FENTANYL CITRATE (PF) 100 MCG/2ML IJ SOLN
INTRAMUSCULAR | Status: AC
  Filled 2016-11-28: qty 2

## 2016-11-28 MED ORDER — NITROGLYCERIN 1 MG/10 ML FOR IR/CATH LAB
INTRA_ARTERIAL | Status: DC | PRN
  Administered 2016-11-28 (×2): 200 ug

## 2016-11-28 MED ORDER — ASPIRIN 81 MG PO CHEW: 81.0000 mg | CHEWABLE_TABLET | ORAL | Status: AC

## 2016-11-28 MED ORDER — IOPAMIDOL (ISOVUE-370) INJECTION 76%
INTRAVENOUS | Status: DC | PRN
Start: 2016-11-28 — End: 2016-11-28
  Administered 2016-11-28: 55 mL via INTRAVENOUS

## 2016-11-28 MED ORDER — HEPARIN (PORCINE) IN NACL 2-0.9 UNIT/ML-% IJ SOLN
INTRAMUSCULAR | Status: AC
  Filled 2016-11-28: qty 1000

## 2016-11-28 MED ORDER — SODIUM CHLORIDE 0.9 % IV SOLN: 250.0000 mL | INTRAVENOUS | Status: DC | PRN

## 2016-11-28 MED ORDER — LORAZEPAM 2 MG/ML IJ SOLN: 0.5000 mg | Freq: Once | INTRAMUSCULAR | Status: DC

## 2016-11-28 MED ORDER — LIDOCAINE HCL (PF) 1 % IJ SOLN
INTRAMUSCULAR | Status: DC | PRN
  Administered 2016-11-28: 2 mL

## 2016-11-28 MED ORDER — MIDAZOLAM HCL 2 MG/2ML IJ SOLN
INTRAMUSCULAR | Status: DC | PRN
  Administered 2016-11-28 (×2): 1 mg via INTRAVENOUS
  Administered 2016-11-28: 2 mg via INTRAVENOUS

## 2016-11-28 MED ORDER — SODIUM CHLORIDE 0.9 % IV SOLN
INTRAVENOUS | Status: DC
  Administered 2016-11-28: 10:00:00 via INTRAVENOUS

## 2016-11-28 MED ORDER — PANTOPRAZOLE SODIUM 40 MG PO TBEC: 40.0000 mg | DELAYED_RELEASE_TABLET | Freq: Two times a day (BID) | ORAL | Status: DC

## 2016-11-28 MED ORDER — FENTANYL CITRATE (PF) 100 MCG/2ML IJ SOLN
INTRAMUSCULAR | Status: DC | PRN
  Administered 2016-11-28 (×2): 25 ug via INTRAVENOUS
  Administered 2016-11-28: 50 ug via INTRAVENOUS

## 2016-11-28 MED ORDER — ISOSORBIDE MONONITRATE ER 60 MG PO TB24: 60.0000 mg | ORAL_TABLET | ORAL | 11 refills | Status: AC

## 2016-11-28 SURGICAL SUPPLY — 14 items
CATH 5FR JL3.5 JR4 ANG PIG MP (CATHETERS) ×2 IMPLANT
CATH INFINITI 5 FR 3DRC (CATHETERS) ×2 IMPLANT
DEVICE RAD COMP TR BAND LRG (VASCULAR PRODUCTS) ×2 IMPLANT
GLIDESHEATH SLEND SS 6F .021 (SHEATH) ×2 IMPLANT
GUIDEWIRE INQWIRE 1.5J.035X260 (WIRE) ×1 IMPLANT
INQWIRE 1.5J .035X260CM (WIRE) ×2
KIT HEART LEFT (KITS) ×2 IMPLANT
PACK CARDIAC CATHETERIZATION (CUSTOM PROCEDURE TRAY) ×2 IMPLANT
SHEATH PINNACLE 5F 10CM (SHEATH) ×2 IMPLANT
SYR MEDRAD MARK V 150ML (SYRINGE) ×2 IMPLANT
TRANSDUCER W/STOPCOCK (MISCELLANEOUS) ×2 IMPLANT
TUBING CIL FLEX 10 FLL-RA (TUBING) ×2 IMPLANT
WIRE EMERALD 3MM-J .035X150CM (WIRE) ×2 IMPLANT
WIRE HI TORQ VERSACORE-J 145CM (WIRE) ×2 IMPLANT

## 2016-11-28 NOTE — Discharge Summary (Signed)
Discharge Summary    Patient ID: Alan Copeland,  MRN: [id number], DOB/AGE: [birth year] 51 y.o.  Admit date: 11/27/2016 Discharge date: 11/28/2016  Primary Care Provider: Patient, No Pcp Per Primary Cardiologist: Bary Castilla [facility] [place])  Discharge Diagnoses    Principal Problem:   Unstable angina [facility]) Active Problems:   Hypertension   GERD (gastroesophageal reflux disease)   DM type 2 (diabetes mellitus, type 2) (HCC)   HLD (hyperlipidemia)   Ischemic chest pain   Coronary artery spasm (HCC)   ACS (acute coronary syndrome) (HCC)   Allergies No Known Allergies  Diagnostic Studies/Procedures    Left Heart Cath  11/28/16   Mid RCA-2 lesion, 10 %stenosed.  Mid RCA-1 lesion, 10 %stenosed.  Dist RCA lesion, 10 %stenosed.  The left ventricular systolic function is normal.  LV end diastolic pressure is normal.  The left ventricular ejection fraction is greater than 65% by visual estimate.  There is no mitral valve regurgitation.   1. Single vessel CAD with patent stent mid RCA. The stent has minimal restenosis.  2. Evidence of coronary vasospasm during the cardiac cath with spasm noted in the mid RCA proximal to the old stent. This resolved with IC NTG 3. Normal LV function.   Recommendations: Continue medical management of CAD. Consider titration of his Imdur given evidence of coronary vasospasm.    Diagnostic Diagram      ____________   History of Present Illness     Alan Copeland is a 51 yo male with a pmhx of CAD (s/p DES to RCA for NSTEMI in 2013 and again in 2015 for ISR), HTN, HLD, DM2, and GERD who presents to our facility as a transfer from [place] Cop[place] for evaluation of chest pain.  Pt sates that he was cooking breakfast early yesterday morning prior to going to work when he had sudden onset of midsternal chest pain that he described as a moderate, 6/10, burning sensation.  He stopped cooking, took an ASA, and went to lay  down, which resulted in improvement of his symptoms after several minutes.  He denied SOB, palpitations, nausea, vomiting, orthopnea, LE edema, or syncope, though he did feel lightheaded and had some left arm tingling during the event. Also notes that he has been struggling with GERD recently and as an UGI endoscopy scheduled in the near future, though he does not think that this felt like his normal heartburn.  He also has noted some left sided jaw pain on and off over the past several days, a symptom he experienced prior to his last AMI.  Pt has been compliant with his medications and has not smoked since his AMI in 2015.  Hospital Course     Consultants: None  Patient had negative troponins 4. The EKG showed lateral wall ST elevation in the pattern of early repolarization. The patient was taken to the Cath Lab and found to have patent stent to the RCA with minimal in-stent restenosis. There was evidence of coronary vasospasm during the cardiac cath with spasm noted in the mid RCA proximal to the old stent. This resolved with intracardiac nitroglycerin. He had normal LV function. Recommendation is to continue medical management of CAD and we will increase his Imdur given the evidence of coronary vasospasm.   -Continue aspirin 81 mg daily, Plavix 75 mg daily, metoprolol 12.5 mg twice a day and Lipitor 20 mg daily  Hypertension -Blood pressure well-controlled on Lopressor 12.5 mg twice a day, lisinopril-hydrochlorothiazide 20-12.5 mg  daily and Imdur  GERD -Patient has a history of "severe heartburn" and is planned for an EGD after he gets cardiac clearance to be able to stop Plavix for 3 days prior to procedure. He should discuss this at his appointment with Alan Copeland -Continue PPI  Hyperlipidemia -Lipid panel on 06/22/2016 showed LDL of 85 and total cholesterol 145 -Continue Lipitor 20 g daily  Pt is requesting to be followed by Alan Copeland and Alan Copeland from now forward.   Patient  has been seen by Alan Copeland today and deemed ready for discharge home. All follow up appointments have been scheduled. Discharge medications are listed below. _____________  Discharge Vitals Blood pressure 116/66, pulse 67, temperature 97.9 F (36.6 C), temperature source Oral, resp. rate 13, height 6\' 2"  (1.88 m), weight 203 lb 3.2 oz (92.2 kg), SpO2 100 %.  Filed Weights   11/27/16 1334 11/27/16 2058 11/28/16 0504  Weight: 210 lb (95.3 kg) 205 lb 6.4 oz (93.2 kg) 203 lb 3.2 oz (92.2 kg)    Labs & Radiologic Studies    CBC  Recent Labs  11/27/16 1400  WBC 8.1  HGB 14.7  HCT 42.7  MCV 64.7*  PLT 346   Basic Metabolic Panel  Recent Labs  11/27/16 1400 11/28/16 0310  NA 135 136  K 3.7 3.8  CL 102 104  CO2 23 26  GLUCOSE 99 69  BUN 14 11  CREATININE 1.15 1.10  CALCIUM 9.3 8.9   Liver Function Tests  Recent Labs  11/27/16 1358  AST 18  ALT 16*  ALKPHOS 53  BILITOT 0.7  PROT 8.4*  ALBUMIN 4.5   No results for input(s): LIPASE, AMYLASE in the last 72 hours. Cardiac Enzymes  Recent Labs  11/27/16 2230 11/28/16 0310 11/28/16 0919  TROPONINI <0.03 <0.03 <0.03   BNP Invalid input(s): POCBNP D-Dimer No results for input(s): DDIMER in the last 72 hours. Hemoglobin A1C No results for input(s): HGBA1C in the last 72 hours. Fasting Lipid Panel  Recent Labs  11/28/16 0310  CHOL 135  HDL 42  LDLCALC 84  TRIG 43  CHOLHDL 3.2   Thyroid Function Tests  Recent Labs  11/27/16 2230  TSH 4.387   _____________  Dg Chest 2 View  Result Date: 11/27/2016 CLINICAL DATA:  Chest pain. EXAM: CHEST  2 VIEW COMPARISON:  Radiographs of August 18, 2012. FINDINGS: The heart size and mediastinal contours are within normal limits. Both lungs are clear. No pneumothorax or pleural effusion is noted. The visualized skeletal structures are unremarkable. IMPRESSION: No active cardiopulmonary disease. Electronically Signed   By: Alan RaiderJames  Green Copeland, M.D.   On: 11/27/2016 14:27     Disposition   Pt is being discharged home today in good condition.  Follow-up Plans & Appointments    Follow-up Information    Alan Copeland MontanaDunn, Dayna N, PA-C Follow up.   Specialties:  Cardiology, Radiology Why:  On June 28 at 2:30 for cardiology hospital follow up.  Contact information: [address] [place] [id number] [phone]          Discharge Instructions    Diet - low sodium heart healthy    Complete by:  As directed    Discharge instructions    Complete by:  As directed    PLEASE REMEMBER TO BRING ALL OF YOUR MEDICATIONS TO EACH OF YOUR FOLLOW-UP OFFICE VISITS.  PLEASE ATTEND ALL SCHEDULED FOLLOW-UP APPOINTMENTS.   Activity: Increase activity slowly as tolerated. You may shower, but no  soaking baths (or swimming) for 1 week. No driving for 24 hours. No lifting over 5 lbs for 1 week. No sexual activity for 1 week.   You May Return to Work: in 1 week (if applicable)  Wound Care: You may wash cath site gently with soap and water. Keep cath site clean and dry. If you notice pain, swelling, bleeding or pus at your cath site, please call [phone].   Increase activity slowly    Complete by:  As directed       Discharge Medications   Current Discharge Medication List    CONTINUE these medications which have CHANGED   Details  isosorbide mononitrate (IMDUR) 60 MG 24 hr tablet Take 1 tablet (60 mg total) by mouth every morning. Qty: 30 tablet, Refills: 11      CONTINUE these medications which have NOT CHANGED   Details  aspirin 81 MG tablet Take 81 mg by mouth daily.    atorvastatin (LIPITOR) 20 MG tablet Take 1 tablet (20 mg total) by mouth daily. Qty: 90 tablet, Refills: 3    clopidogrel (PLAVIX) 75 MG tablet Take 75 mg by mouth daily with breakfast.    lisinopril-hydrochlorothiazide (PRINZIDE,ZESTORETIC) 20-12.5 MG tablet Take 1 tablet by mouth daily. Qty: 90 tablet, Refills: 3    metFORMIN (GLUCOPHAGE) 500 MG tablet Take 1 tablet  (500 mg total) by mouth daily with breakfast. Qty: 90 tablet, Refills: 0    metoprolol tartrate (LOPRESSOR) 25 MG tablet Take 0.5 tablets (12.5 mg total) by mouth 2 (two) times daily. Qty: 90 tablet, Refills: 3    nitroGLYCERIN (NITROSTAT) 0.4 MG SL tablet Place 1 tablet (0.4 mg total) under the tongue every 5 (five) minutes as needed for chest pain. Qty: 25 tablet, Refills: 3    pantoprazole (PROTONIX) 20 MG tablet Take 1 tablet (20 mg total) by mouth daily. Qty: 90 tablet, Refills: 3    Cetirizine HCl (ZYRTEC ALLERGY) 10 MG CAPS Take 1 capsule (10 mg total) by mouth daily. Qty: 30 capsule, Refills: 1    sucralfate (CARAFATE) 1 G tablet Take 1 tablet (1 g total) by mouth 4 (four) times daily. Qty: 60 tablet, Refills: 0         Outstanding Labs/Studies   None  Duration of Discharge Encounter   Greater than 30 minutes including physician time.  Signed, [name] NP 11/28/2016, 12:43 PM

## 2016-11-28 NOTE — Discharge Instructions (Signed)

## 2016-11-28 NOTE — Progress Notes (Signed)
Site area: rt groin fa sheath Site Prior to Removal:  Level 0 Pressure Applied For: 20 minutes Manual:   yes Patient Status During Pull:  stable Post Pull Site:  Level 0 Post Pull Instructions Given:  yes Post Pull Pulses Present: palpable Dressing Applied:  Gauze and tegaderm Bedrest begins @ 1200 Comments:

## 2016-11-28 NOTE — Progress Notes (Signed)
Progress Note  Patient Name: Alan Copeland Date of Encounter: 11/28/2016  [facility] [place])  Subjective   No further chest pain like he presented with since arrival. No shortness of breath, but pt continues to have mild chest burning like his usual indigestion and pain/numbness of 3 fingers on left hand.   Inpatient Medications    Scheduled Meds: . aspirin  81 mg Oral Daily  . atorvastatin  80 mg Oral QHS  . clopidogrel  75 mg Oral Q breakfast  . lisinopril  20 mg Oral Daily   And  . hydrochlorothiazide  12.5 mg Oral Daily  . insulin aspart  0-15 Units Subcutaneous TID WC  . insulin aspart  0-5 Units Subcutaneous QHS  . isosorbide mononitrate  30 mg Oral Daily  . metoprolol tartrate  12.5 mg Oral BID  . pantoprazole  20 mg Oral Daily   Continuous Infusions: . heparin 1,100 Units/hr (11/28/16 0547)   PRN Meds: acetaminophen, morphine injection, nitroGLYCERIN, ondansetron (ZOFRAN) IV   Vital Signs    Vitals:   11/27/16 1930 11/27/16 2058 11/27/16 2359 11/28/16 0504  BP: 123/72 137/77 124/75 116/68  Pulse: 72 63 65 64  Resp: 16 (!) 22 19 12   Temp:  98 F (36.7 C) 97.5 F (36.4 C) 97.6 F (36.4 C)  TempSrc:  Oral Oral Oral  SpO2: 100% 99% 100% 100%  Weight:  205 lb 6.4 oz (93.2 kg)  203 lb 3.2 oz (92.2 kg)  Height:  6\' 2"  (1.88 m)      Intake/Output Summary (Last 24 hours) at 11/28/16 0829 Last data filed at 11/28/16 0551  Gross per 24 hour  Intake           344.84 ml  Output              675 ml  Net          -330.16 ml   Filed Weights   11/27/16 1334 11/27/16 2058 11/28/16 0504  Weight: 210 lb (95.3 kg) 205 lb 6.4 oz (93.2 kg) 203 lb 3.2 oz (92.2 kg)    Telemetry    Sinus bradycardia/sinus rhythm with rates in the 50s and 60s - Personally Reviewed  ECG    11/27/16 at 1332 showed sinus rhythm at 87 bpm with nonspecific T-wave abnormalities in the inferior and lateral leads, QTC 445 11/28/16 EKG  showed more prominent upward T waves with a slight upsloping - Personally Reviewed  Physical Exam   GEN: No acute distress.   Neck: No JVD Cardiac: RRR, no murmurs, rubs, or gallops.  Respiratory: Clear to auscultation bilaterally. GI: Soft, nontender, non-distended  MS: No edema; No deformity. Neuro:  Nonfocal  Psych: Normal affect   Labs    Chemistry Recent Labs Lab 11/27/16 1358 11/27/16 1400 11/28/16 0310  NA  --  135 136  K  --  3.7 3.8  CL  --  102 104  CO2  --  23 26  GLUCOSE  --  99 69  BUN  --  14 11  CREATININE  --  1.15 1.10  CALCIUM  --  9.3 8.9  PROT 8.4*  --   --   ALBUMIN 4.5  --   --   AST 18  --   --   ALT 16*  --   --   ALKPHOS 53  --   --   BILITOT 0.7  --   --   GFRNONAA  --  >  60 >60  GFRAA  --  >60 >60  ANIONGAP  --  10 6     Hematology Recent Labs Lab 11/27/16 1400  WBC 8.1  RBC 6.60*  HGB 14.7  HCT 42.7  MCV 64.7*  MCH 22.3*  MCHC 34.4  RDW 19.5*  PLT 346    Cardiac Enzymes Recent Labs Lab 11/27/16 1358 11/27/16 1644 11/27/16 2230 11/28/16 0310  TROPONINI <0.03 <0.03 <0.03 <0.03   No results for input(s): TROPIPOC in the last 168 hours.   BNPNo results for input(s): BNP, PROBNP in the last 168 hours.   DDimer No results for input(s): DDIMER in the last 168 hours.   Radiology    Dg Chest 2 View  Result Date: 11/27/2016 CLINICAL DATA:  Chest pain. EXAM: CHEST  2 VIEW COMPARISON:  Radiographs of August 18, 2012. FINDINGS: The heart size and mediastinal contours are within normal limits. Both lungs are clear. No pneumothorax or pleural effusion is noted. The visualized skeletal structures are unremarkable. IMPRESSION: No active cardiopulmonary disease. Electronically Signed   By: Alan Copeland, M.D.   On: 11/27/2016 14:27    Cardiac Studies   Cardiac catheterization 06/17/15 by Dr. Clifton Jones Conclusion   1. Single vessel CAD. 2. Patent stent mid RCA with minimal diffuse restenosis.  3. Normal LV systolic function.    Recommendations: Continue medical management of CAD.         Patient Profile     51 y.o. male CAD, MI with stent to RCA in 2013, restenosis and stenting of RCA 2015, hypertension, diabetes type 2, hyperlipidemia, and GERD who presented to [facility] as a transfer from [facility] for evaluation of chest pain associated with lightheadedness and some left arm tingling. He also has some left-sided jaw pain on and off that is similar to his previous MI. He does have problems with GERD and has an upper GI scheduled in the near future. Cardiac cath done in 06/2015 showed patent stent to mid RCA with minimal diffuse restenosis and no other obstructive disease.  Assessment & Plan    Chest pain -Patient with history of MI and stent to RCA in 2013 and restenosis and subsequent stenting to RCA in 2015. Cardiac cath 06/17/2015 showed patent RCA stent and no other obstructive disease -Patient with single episode of sharp burning central chest pain that lasted a few minutes and resolved when he lay down yesterday morning. No recurrence of this since presentation. He has had left arm pain and occasional left jaw pain for the last 3 days and tingling of the left hand -Troponins negative 4 -EKG initially showed nonspecific T-wave abnormalities in the inferior and lateral leads, follow-up EKG this morning shows more prominent T waves with a slight upsloping from the J-point  GERD -Patient has a history of "severe heartburn" and is planned for an EGD after he gets cardiac clearance to be able to stop Plavix for 3 days prior to procedure -Continue PPI  Hyperlipidemia -Lipid panel on 06/22/2016 showed LDL of 85 and total cholesterol 145 -Continue statin  Diabetes -Last A1c on 06/22/16 was 6.0 -CBGs and Sliding scale insulin while here  Signed, Alan Bon, NP  11/28/2016, 8:29 AM    The patient was seen, examined and discussed with Alan Bon, NP-C and I agree with the above.   51 y.o. male CAD,  MI with stent to RCA in 2013, restenosis and stenting of RCA 2015, last cath in 06/2015 - showed patent RCA stent and otherwise just  non-obstructive CAD, also h/o hypertension, diabetes type 2, hyperlipidemia, and GERD who presented to [facility] as a transfer from [facility] for evaluation of chest pain associated with lightheadedness and some left arm tingling, and jaw pain, not related to exertion. Radiation to the beck. This is similar as his prior presentation. He stopped smikong in 2013. Troponin negative x 3, ECG shows lateral wall STE in a patternt of early repolarization. He will be scheduled for a left heart cath today. He is on ASA/Plavix, high dose atorvastatin 80 mg po daily, lisinopril, metoprolol, imdur, BP and HR at goal.  Tobias Alexander, MD 11/28/2016

## 2016-11-28 NOTE — Interval H&P Note (Signed)
History and Physical Interval Note:  11/28/2016 10:21 AM  Alan Copeland  has presented today for cardiac cath with the diagnosis of chest pain/unstable angina. The various methods of treatment have been discussed with the patient and family. After consideration of risks, benefits and other options for treatment, the patient has consented to  Procedure(s): Left Heart Cath and Coronary Angiography (N/A) as a surgical intervention .  The patient's history has been reviewed, patient examined, no change in status, stable for surgery.  I have reviewed the patient's chart and labs.  Questions were answered to the patient's satisfaction.    Cath Lab Visit (complete for each Cath Lab visit)  Clinical Evaluation Leading to the Procedure:   ACS: No.  Non-ACS:    Anginal Classification: CCS III  Anti-ischemic medical therapy: Maximal Therapy (2 or more classes of medications)  Non-Invasive Test Results: No non-invasive testing performed  Prior CABG: No previous CABG        Verne Carrowhristopher Katalia Choma

## 2016-11-28 NOTE — Progress Notes (Signed)
ANTICOAGULATION CONSULT NOTE - Follow Up Consult  Pharmacy Consult for heparin Indication: USAP  Labs:  Recent Labs  11/27/16 1400 11/27/16 1644 11/27/16 2230 11/28/16 0310 11/28/16 0450  HGB 14.7  --   --   --   --   HCT 42.7  --   --   --   --   PLT 346  --   --   --   --   HEPARINUNFRC  --   --   --   --  0.70  CREATININE 1.15  --   --  1.10  --   TROPONINI  --  <0.03 <0.03 <0.03  --      Assessment: 51yo male therapeutic on heparin with initial dosing for CP though at very high end of goal.  Goal of Therapy:  Heparin level 0.3-0.7 units/ml  Plan:  Will decrease heparin gtt slightly and recheck level with next lab draw.  Vernard GamblesVeronda Lota Leamer, PharmD, BCPS  11/28/2016,5:32 AM

## 2016-11-28 NOTE — H&P (View-Only) (Signed)
Progress Note  Patient Name: Alan Copeland Date of Encounter: 11/28/2016  Primary Cardiologist: [facility], [place])  Subjective   No further chest pain like he presented with since arrival. No shortness of breath, but pt continues to have mild chest burning like his usual indigestion and pain/numbness of 3 fingers on left hand.   Inpatient Medications    Scheduled Meds: . aspirin  81 mg Oral Daily  . atorvastatin  80 mg Oral QHS  . clopidogrel  75 mg Oral Q breakfast  . lisinopril  20 mg Oral Daily   And  . hydrochlorothiazide  12.5 mg Oral Daily  . insulin aspart  0-15 Units Subcutaneous TID WC  . insulin aspart  0-5 Units Subcutaneous QHS  . isosorbide mononitrate  30 mg Oral Daily  . metoprolol tartrate  12.5 mg Oral BID  . pantoprazole  20 mg Oral Daily   Continuous Infusions: . heparin 1,100 Units/hr (11/28/16 0547)   PRN Meds: acetaminophen, morphine injection, nitroGLYCERIN, ondansetron (ZOFRAN) IV   Vital Signs    Vitals:   11/27/16 1930 11/27/16 2058 11/27/16 2359 11/28/16 0504  BP: 123/72 137/77 124/75 116/68  Pulse: 72 63 65 64  Resp: 16 (!) 22 19 12   Temp:  98 F (36.7 C) 97.5 F (36.4 C) 97.6 F (36.4 C)  TempSrc:  Oral Oral Oral  SpO2: 100% 99% 100% 100%  Weight:  205 lb 6.4 oz (93.2 kg)  203 lb 3.2 oz (92.2 kg)  Height:  6\' 2"  (1.88 m)      Intake/Output Summary (Last 24 hours) at 11/28/16 0829 Last data filed at 11/28/16 0551  Gross per 24 hour  Intake           344.84 ml  Output              675 ml  Net          -330.16 ml   Filed Weights   11/27/16 1334 11/27/16 2058 11/28/16 0504  Weight: 210 lb (95.3 kg) 205 lb 6.4 oz (93.2 kg) 203 lb 3.2 oz (92.2 kg)    Telemetry    Sinus bradycardia/sinus rhythm with rates in the 50s and 60s - Personally Reviewed  ECG    11/27/16 at 1332 showed sinus rhythm at 87 bpm with nonspecific T-wave abnormalities in the inferior and lateral leads, QTC 445 11/28/16 EKG  showed more prominent upward T waves with a slight upsloping - Personally Reviewed  Physical Exam   GEN: No acute distress.   Neck: No JVD Cardiac: RRR, no murmurs, rubs, or gallops.  Respiratory: Clear to auscultation bilaterally. GI: Soft, nontender, non-distended  MS: No edema; No deformity. Neuro:  Nonfocal  Psych: Normal affect   Labs    Chemistry Recent Labs Lab 11/27/16 1358 11/27/16 1400 11/28/16 0310  NA  --  135 136  K  --  3.7 3.8  CL  --  102 104  CO2  --  23 26  GLUCOSE  --  99 69  BUN  --  14 11  CREATININE  --  1.15 1.10  CALCIUM  --  9.3 8.9  PROT 8.4*  --   --   ALBUMIN 4.5  --   --   AST 18  --   --   ALT 16*  --   --   ALKPHOS 53  --   --   BILITOT 0.7  --   --   GFRNONAA  --  >  60 >60  GFRAA  --  >60 >60  ANIONGAP  --  10 6     Hematology Recent Labs Lab 11/27/16 1400  WBC 8.1  RBC 6.60*  HGB 14.7  HCT 42.7  MCV 64.7*  MCH 22.3*  MCHC 34.4  RDW 19.5*  PLT 346    Cardiac Enzymes Recent Labs Lab 11/27/16 1358 11/27/16 1644 11/27/16 2230 11/28/16 0310  TROPONINI <0.03 <0.03 <0.03 <0.03   No results for input(s): TROPIPOC in the last 168 hours.   BNPNo results for input(s): BNP, PROBNP in the last 168 hours.   DDimer No results for input(s): DDIMER in the last 168 hours.   Radiology    Dg Chest 2 View  Result Date: 11/27/2016 CLINICAL DATA:  Chest pain. EXAM: CHEST  2 VIEW COMPARISON:  Radiographs of August 18, 2012. FINDINGS: The heart size and mediastinal contours are within normal limits. Both lungs are clear. No pneumothorax or pleural effusion is noted. The visualized skeletal structures are unremarkable. IMPRESSION: No active cardiopulmonary disease. Electronically Signed   By: Lupita Raider, M.D.   On: 11/27/2016 14:27    Cardiac Studies   Cardiac catheterization 06/17/15 by Dr. Clifton Imad Conclusion   1. Single vessel CAD. 2. Patent stent mid RCA with minimal diffuse restenosis.  3. Normal LV systolic function.    Recommendations: Continue medical management of CAD.         Patient Profile     51 y.o. male CAD, MI with stent to RCA in 2013, restenosis and stenting of RCA 2015, hypertension, diabetes type 2, hyperlipidemia, and GERD who presented to [facility] as a transfer from [facility] for evaluation of chest pain associated with lightheadedness and some left arm tingling. He also has some left-sided jaw pain on and off that is similar to his previous MI. He does have problems with GERD and has an upper GI scheduled in the near future. Cardiac cath done in 06/2015 showed patent stent to mid RCA with minimal diffuse restenosis and no other obstructive disease.  Assessment & Plan    Chest pain -Patient with history of MI and stent to RCA in 2013 and restenosis and subsequent stenting to RCA in 2015. Cardiac cath 06/17/2015 showed patent RCA stent and no other obstructive disease -Patient with single episode of sharp burning central chest pain that lasted a few minutes and resolved when he lay down yesterday morning. No recurrence of this since presentation. He has had left arm pain and occasional left jaw pain for the last 3 days and tingling of the left hand -Troponins negative 4 -EKG initially showed nonspecific T-wave abnormalities in the inferior and lateral leads, follow-up EKG this morning shows more prominent T waves with a slight upsloping from the J-point  GERD -Patient has a history of "severe heartburn" and is planned for an EGD after he gets cardiac clearance to be able to stop Plavix for 3 days prior to procedure -Continue PPI  Hyperlipidemia -Lipid panel on 06/22/2016 showed LDL of 85 and total cholesterol 145 -Continue statin  Diabetes -Last A1c on 06/22/16 was 6.0 -CBGs and Sliding scale insulin while here  Signed, [name], NP  11/28/2016, 8:29 AM    The patient was seen, examined and discussed with [name], NP-C and I agree with the above.   51 y.o. male CAD,  MI with stent to RCA in 2013, restenosis and stenting of RCA 2015, last cath in 06/2015 - showed patent RCA stent and otherwise just  non-obstructive CAD, also h/o hypertension, diabetes type 2, hyperlipidemia, and GERD who presented to [facility] as a transfer from [facility] for evaluation of chest pain associated with lightheadedness and some left arm tingling, and jaw pain, not related to exertion. Radiation to the beck. This is similar as his prior presentation. He stopped smikong in 2013. Troponin negative x 3, ECG shows lateral wall STE in a patternt of early repolarization. He will be scheduled for a left heart cath today. He is on ASA/Plavix, high dose atorvastatin 80 mg po daily, lisinopril, metoprolol, imdur, BP and HR at goal.  Tobias Alexander, MD 11/28/2016

## 2016-11-28 NOTE — Progress Notes (Signed)
TR band deflated and removed per protocol - gauze and tegaderm in place post band removal. Bedrest completed and patient ambulated ~[id number] without difficulty. Right groin level "0" before and after walking. Discharge order written. Instructions reviewed and given to patient and his wife. Pt and wife both voiced understanding. Pt discharged via wheelchair in stable condition.

## 2016-11-29 LAB — HEMOGLOBIN A1C
HEMOGLOBIN A1C: 6 % — AB (ref 4.8–5.6)
MEAN PLASMA GLUCOSE: 126 mg/dL

## 2016-11-29 MED FILL — Heparin Sodium (Porcine) Inj 1000 Unit/ML: INTRAMUSCULAR | Qty: 10 | Status: AC

## 2016-11-29 MED FILL — Verapamil HCl IV Soln 2.5 MG/ML: INTRAVENOUS | Qty: 2 | Status: AC

## 2016-12-05 NOTE — Progress Notes (Signed)
Cardiology Office Note:    Date:  12/06/2016   ID:  Alan Copeland, DOB 03-Apr-1966, MRN [id number]  PCP:  Patient, No Pcp Per  Cardiologist:  Dr Clifton Khalil  Referring MD: No ref. provider found   Chief Complaint  Patient presents with  . Hospitalization Follow-up    post cath    History of Present Illness:    Alan Copeland is a 51 y.o. male with a hx of CAD, hypertension, hyperlipidemia, diabetes type 2, and GERD who is here for follow-up after cardiac cath.  The patient has a history of drug-eluting stent to the RCA for non-STEMI in 2013 and again in 2015 or in-stent restenosis. He was admitted to the hospital on 11/27/16 for evaluation of chest pain. The patient ruled out for acute MI. He was taken to the Cath Lab and found to have patent stents to RCA with minimal in-stent restenosis. There was evidence of coronary vasospasm during the cardiac cath with spasm noted in the mid RCA proximal to the old stent. This resolved with intracardiac nitroglycerin. He had a normal LV function. Recommendation was to continue medical management of CAD and increase his Imdur given the evidence of coronary vasospasm.  He is here alone. Since discharge he has had no chest pain or dyspnea. No orthopnea, PND or edema. He is back to work without problems. He has been having a headache for the last 4 days and also sneezing and he is attributing these to allergies. He started Tylenol extra strength yesterday which has not helped as of yet. He uses generic for flonase.  The patient was previously seen in [facility] for cardiology and has been seen in the past by Dr. Eden Emms in the hospital. During this last hospitalization he requested not to return to [facility] and wanted to follow with Dr. Clifton Conor who did his cath.   He is today complaining of early ejaculation, no problems with erection, for several years ever since he quit smoking and drinking heavily.   Pt has plans for EGD and colonoscopy but needs  clearance to stop Plavix briefly prior to scheduling. He has had his esophagus stretched before.   Past Medical History:  Diagnosis Date  . Anginal pain (HCC)   . CAD (coronary artery disease) 01/25/2012   a. RCA ptca/des 2013 with NSTEMI b. restenosis of RCA in 07/2013 w/ DES placed c. cath 06/17/2014 negative cath, patent stent in RCA with minimal restenosis  d. LHC 11/28/16- no change  . Carotid bruit   . Coronary artery spasm (HCC) 11/28/2016   See on cath 11/28/16  . High cholesterol   . Hypertension   . Sciatica   . Sinus headache    "weekly" (07/24/2013)  . Type II diabetes mellitus (HCC) dx'd 2013    Past Surgical History:  Procedure Laterality Date  . CARDIAC CATHETERIZATION  07/24/2013  . CARDIAC CATHETERIZATION N/A 06/17/2015   Procedure: Left Heart Cath and Coronary Angiography;  Surgeon: Kathleene Hazel, MD;  Location: [facility] INVASIVE CV LAB;  Service: Cardiovascular;  Laterality: N/A;  . CORONARY ANGIOPLASTY WITH STENT PLACEMENT  01/2012   "1"  . INGUINAL HERNIA REPAIR  ~ 1985   left  . LEFT HEART CATH AND CORONARY ANGIOGRAPHY N/A 11/28/2016   Procedure: Left Heart Cath and Coronary Angiography;  Surgeon: Kathleene Hazel, MD;  Location: [facility] INVASIVE CV LAB;  Service: Cardiovascular;  Laterality: N/A;  . LEFT HEART CATHETERIZATION WITH CORONARY ANGIOGRAM N/A 01/26/2012   Procedure: LEFT HEART  CATHETERIZATION WITH CORONARY ANGIOGRAM;  Surgeon: Lesleigh NoeHenry W Smith III, MD;  [facility];  Service: Cardiovascular;  Laterality: N/A;  . LEFT HEART CATHETERIZATION WITH CORONARY ANGIOGRAM N/A 07/24/2013   Procedure: LEFT HEART CATHETERIZATION WITH CORONARY ANGIOGRAM;  Surgeon: Runell GessJonathan J Berry, MD;  [facility];  Service: Cardiovascular;  Laterality: N/A;  . PERCUTANEOUS CORONARY STENT INTERVENTION (PCI-S) N/A 07/27/2013   Procedure: PERCUTANEOUS CORONARY STENT INTERVENTION (PCI-S);  Surgeon: Micheline ChapmanMichael D Cooper, MD;  [facility];  Service: Cardiovascular;   Laterality: N/A;    Current Medications: Current Meds  Medication Sig  . aspirin 81 MG tablet Take 81 mg by mouth daily.  Marland Kitchen. atorvastatin (LIPITOR) 20 MG tablet Take 20 mg by mouth at bedtime.  . clopidogrel (PLAVIX) 75 MG tablet Take 75 mg by mouth daily with breakfast.  . isosorbide mononitrate (IMDUR) 60 MG 24 hr tablet Take 1 tablet (60 mg total) by mouth every morning.  Marland Kitchen. lisinopril-hydrochlorothiazide (PRINZIDE,ZESTORETIC) 20-12.5 MG tablet Take 1 tablet by mouth daily.  . metFORMIN (GLUCOPHAGE) 500 MG tablet Take 500 mg by mouth at bedtime.  . metoprolol tartrate (LOPRESSOR) 25 MG tablet Take 25 mg by mouth 2 (two) times daily.  . nitroGLYCERIN (NITROSTAT) 0.4 MG SL tablet Place 1 tablet (0.4 mg total) under the tongue every 5 (five) minutes as needed for chest pain.  . pantoprazole (PROTONIX) 20 MG tablet Take 20 mg by mouth 2 (two) times daily.  . [DISCONTINUED] Cetirizine HCl (ZYRTEC ALLERGY) 10 MG CAPS Take 1 capsule (10 mg total) by mouth daily.     Allergies:   Patient has no known allergies.   Social History   Social History  . Marital status: Married    Spouse name: N/A  . Number of children: N/A  . Years of education: N/A   Social History Main Topics  . Smoking status: Former Smoker    Packs/day: 0.50    Years: 29.00    Types: Cigarettes    Quit date: 06/11/2013  . Smokeless tobacco: Never Used  . Alcohol use 0.6 oz/week    1 Cans of beer per week     Comment: 1 can of beer every 1-2 weeks  . Drug use: No     Comment: Last used in 2013  . Sexual activity: Yes   Other Topics Concern  . None   Social History Narrative  . None     Family History: The patient's family history includes Diabetes in his father and mother; Hypertension in his brother, father, and mother. ROS:   Please see the history of present illness.     All other systems reviewed and are negative.  EKGs/Labs/Other Studies Reviewed:    The following studies were reviewed today: Left  Heart Cath  11/28/16   Mid RCA-2 lesion, 10 %stenosed.  Mid RCA-1 lesion, 10 %stenosed.  Dist RCA lesion, 10 %stenosed.  The left ventricular systolic function is normal.  LV end diastolic pressure is normal.  The left ventricular ejection fraction is greater than 65% by visual estimate.  There is no mitral valve regurgitation.  1. Single vessel CAD with patent stent mid RCA. The stent has minimal restenosis.  2. Evidence of coronary vasospasm during the cardiac cath with spasm noted in the mid RCA proximal to the old stent. This resolved with IC NTG 3. Normal LV function.   Recommendations: Continue medical management of CAD. Consider titration of his Imdur given evidence of coronary vasospasm.    Diagnostic Diagram  EKG:  EKG is not ordered today.    Recent Labs: 11/27/2016: ALT 16; Hemoglobin 14.7; Platelets 346; TSH 4.387 11/28/2016: BUN 11; Creatinine, Ser 1.10; Potassium 3.8; Sodium 136   Recent Lipid Panel    Component Value Date/Time   CHOL 135 11/28/2016 0310   TRIG 43 11/28/2016 0310   HDL 42 11/28/2016 0310   CHOLHDL 3.2 11/28/2016 0310   VLDL 9 11/28/2016 0310   LDLCALC 84 11/28/2016 0310    Physical Exam:    VS:  BP 110/70   Pulse 74   Ht 6\' 2"  (1.88 m)   Wt 206 lb 1.9 oz (93.5 kg)   SpO2 98%   BMI 26.46 kg/m     Wt Readings from Last 3 Encounters:  12/06/16 206 lb 1.9 oz (93.5 kg)  11/28/16 203 lb 3.2 oz (92.2 kg)  06/17/15 204 lb (92.5 kg)     GEN:  Well nourished, well developed in no acute distress HEENT: Normal NECK: No JVD; No carotid bruits LYMPHATICS: No lymphadenopathy CARDIAC: RRR, no murmurs, rubs, gallops RESPIRATORY:  Clear to auscultation without rales, wheezing or rhonchi  ABDOMEN: Soft, non-tender, non-distended MUSCULOSKELETAL:  No edema; No deformity  SKIN: Warm and dry NEUROLOGIC:  Alert and oriented x 3 PSYCHIATRIC:  Normal affect  Right wrist and right groin cath sites well healed.    ASSESSMENT:     1. Coronary artery disease involving native coronary artery of native heart with angina pectoris with documented spasm (HCC)   2. Essential hypertension   3. Gastroesophageal reflux disease without esophagitis   4. Hyperlipidemia, unspecified hyperlipidemia type   5. Sexual dysfunction    PLAN:    In order of problems listed above:  CAD  -History of DES to RCA for non-STEMI in 2013 with in-stent restenosis in 2015. Hospitalized with chest pain on 11/27/16. Cardiac cath showed patent stent to the RCA but evidence of coronary vasospasm. Imdur was increased to 60 mg daily. Pt now with daily headache for last 4 days, not much improved with Tylenol that he started yesterday. Discussed the self limiting nature of headache associated with Imdur. Offered to lower Imdur back to 30 mg and add low dose amlodipine. Pt wants to continue the Imdur for now and let us know if headache continues after another week. -Continue aspirin, Plavix, BB, statin -Pt needs EGD and colonoscopy. Will check with MD as to whether pt can hold Plavix for procedure.  Hypertension  -BP well controlled lisinopril-HCTZ, metoprolol and Imdur  GERD -No current complaints -Needs EGD and colonoscopy  Hyperlipidemia -LDL 84 on 11/28/16 -continue atorvastatin 20 mg   Sexual dysfunction  -No problem with obtaining an erection, but achieves ejaculation in 1-2 minute. Has been like this for several years since he quit smoking and drinking. He only has intercourse about once a month and does not masturbate. Pt advised to increase activity and wear a condom to decrease stimulation. Also advised to see a PCP and/or a urologist if continued problems.  Medication Adjustments/Labs and Tests Ordered: Current medicines are reviewed at length with the patient today.  Concerns regarding medicines are outlined above. Labs and tests ordered and medication changes are outlined in the patient instructions below:  Patient Instructions   Medication Instructions:  Your physician recommends that you continue on your current medications as directed. Please refer to the Current Medication list given to you today.  Take Tylenol with the Isosorbide.  If the headaches continue after another week, call me and we will  have to make a change in the medication.  I will check on you holding the Plavix for the Colonoscopy and get back with you.  Labwork: None ordered  Testing/Procedures: None ordered  Follow-Up: Your physician recommends that you schedule a follow-up appointment in: WILL BE DETERMINED  Any Other Special Instructions Will Be Listed Below (If Applicable).     If you need a refill on your cardiac medications before your next appointment, please call your pharmacy.   Signed, [name], NP  12/06/2016 4:30 PM    [facility]

## 2016-12-06 ENCOUNTER — Ambulatory Visit (INDEPENDENT_AMBULATORY_CARE_PROVIDER_SITE_OTHER): Payer: 59 | Admitting: Cardiology

## 2016-12-06 ENCOUNTER — Encounter: Payer: Self-pay | Admitting: Cardiology

## 2016-12-06 VITALS — BP 110/70 | HR 74 | Ht 74.0 in | Wt 206.1 lb

## 2016-12-06 DIAGNOSIS — R37 Sexual dysfunction, unspecified: Secondary | ICD-10-CM

## 2016-12-06 DIAGNOSIS — K219 Gastro-esophageal reflux disease without esophagitis: Secondary | ICD-10-CM | POA: Diagnosis not present

## 2016-12-06 DIAGNOSIS — I25111 Atherosclerotic heart disease of native coronary artery with angina pectoris with documented spasm: Secondary | ICD-10-CM | POA: Diagnosis not present

## 2016-12-06 DIAGNOSIS — E785 Hyperlipidemia, unspecified: Secondary | ICD-10-CM

## 2016-12-06 DIAGNOSIS — I10 Essential (primary) hypertension: Secondary | ICD-10-CM

## 2016-12-06 NOTE — Patient Instructions (Addendum)
Medication Instructions:  Your physician recommends that you continue on your current medications as directed. Please refer to the Current Medication list given to you today.  Take Tylenol with the Isosorbide.  If the headaches continue after another week, call me and we will have to make a change in the medication.  I will check on you holding the Plavix for the Colonoscopy and get back with you.  Labwork: None ordered  Testing/Procedures: None ordered  Follow-Up: Your physician recommends that you schedule a follow-up appointment in: WILL BE DETERMINED  Any Other Special Instructions Will Be Listed Below (If Applicable).     If you need a refill on your cardiac medications before your next appointment, please call your pharmacy.

## 2016-12-07 ENCOUNTER — Telehealth: Payer: Self-pay | Admitting: Cardiology

## 2016-12-07 NOTE — Telephone Encounter (Signed)
I checked with Dr. Elease HashimotoNahser and pt is OK to have EGD/colonoscopy and can stop plavix for 5 days if needed for procedure. Continue aspirin if possible. Resume Plavix after procedure.   [name].Maresha Anastos, AGNP-C 12/07/2016  3:24 PM Pager: [phone]

## 2016-12-10 NOTE — Telephone Encounter (Signed)
Called pt to let him know that he is ok to have his colonoscopy and to stop the Plavix for 5 days before procedure, if needed, but his GI Dr should instruct him on that. Pt advised that we sent his sx clearance over the [facility] on Friday, 12/07/16.  Pt very appreciative and verbalized understanding.

## 2017-11-29 ENCOUNTER — Ambulatory Visit: Payer: 59 | Admitting: Cardiology

## 2017-12-18 ENCOUNTER — Ambulatory Visit: Payer: 59 | Admitting: Cardiology

## 2018-01-28 NOTE — Progress Notes (Signed)
Cardiology Office Note:    Date:  01/31/2018   ID:  Alan Copeland, DOB 06/08/1966, MRN [id number]  PCP:  Patient, No Pcp Per  Cardiologist:  Verne Carrow, MD  Referring MD: No ref. provider found   Chief Complaint  Patient presents with  . Chest Pain    History of Present Illness:    Alan Copeland is a 52 y.o. male with a past medical history significant for CAD, hypertension, hyperlipidemia, diabetes type 2, and GERD. He has a history of non-STEMI with DES to RCA in 2013 and  in-stent restenosis in 2015.  In 11/2016 he was evaluated for chest pain and ruled out for MI.  Cardiac catheterization at that time found patent stents to the RCA with minimal in-stent restenosis and evidence of coronary vasospasm during the cath.  He had normal LV function at that time.  His Imdur was increased for prevention of vasospasm.  No chest pressure/tightness or shortness of breath. He does have occ stabbing pains in the left chest that last a second. He works in [employer]. He does not exercise. He takes the bus for transportation. He has had no exertional symptoms with walking to the bus stop but he is exercise intolerant with lightheadedness and sweating when he tries to increase his activity out in the heat. He has had constant jaw aching for the last 2 weeks that seems to aggravated with movement of his jaw. He notes that he clenches his teeth a lot. He is under increased stress due his mother having end stage COPD and is on hospice. For about the last month he has had Left arm pain, shoulder/upper arm aching that is constant. It is not brought on by exertion. It seems to improve when he raises his arm up over his head. 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 No orthopnea, PND, edema.   He is concerned since his discomfort involves his left arm and jaw.     Past Medical History:  Diagnosis Date  . Anginal pain (HCC)   . CAD (coronary artery disease)  01/25/2012   a. RCA ptca/des 2013 with NSTEMI b. restenosis of RCA in 07/2013 w/ DES placed c. cath 06/17/2014 negative cath, patent stent in RCA with minimal restenosis  d. LHC 11/28/16- no change  . Carotid bruit   . Coronary artery spasm (HCC) 11/28/2016   See on cath 11/28/16  . High cholesterol   . Hypertension   . Sciatica   . Sinus headache    "weekly" (07/24/2013)  . Type II diabetes mellitus (HCC) dx'd 2013    Past Surgical History:  Procedure Laterality Date  . CARDIAC CATHETERIZATION  07/24/2013  . CARDIAC CATHETERIZATION N/A 06/17/2015   Procedure: Left Heart Cath and Coronary Angiography;  Surgeon: Kathleene Hazel, MD;  Location: [facility] INVASIVE CV LAB;  Service: Cardiovascular;  Laterality: N/A;  . CORONARY ANGIOPLASTY WITH STENT PLACEMENT  01/2012   "1"  . INGUINAL HERNIA REPAIR  ~ 1985   left  . LEFT HEART CATH AND CORONARY ANGIOGRAPHY N/A 11/28/2016   Procedure: Left Heart Cath and Coronary Angiography;  Surgeon: Kathleene Hazel, MD;  Location: [facility] INVASIVE CV LAB;  Service: Cardiovascular;  Laterality: N/A;  . LEFT HEART CATHETERIZATION WITH CORONARY ANGIOGRAM N/A 01/26/2012   Procedure: LEFT HEART CATHETERIZATION WITH CORONARY ANGIOGRAM;  Surgeon: Lesleigh Noe, MD;  Location: [facility] CATH LAB;  Service: Cardiovascular;  Laterality: N/A;  . LEFT HEART CATHETERIZATION WITH  CORONARY ANGIOGRAM N/A 07/24/2013   Procedure: LEFT HEART CATHETERIZATION WITH CORONARY ANGIOGRAM;  Surgeon: Runell Gess, MD;  Location: [facility] CATH LAB;  Service: Cardiovascular;  Laterality: N/A;  . PERCUTANEOUS CORONARY STENT INTERVENTION (PCI-S) N/A 07/27/2013   Procedure: PERCUTANEOUS CORONARY STENT INTERVENTION (PCI-S);  Surgeon: Micheline Chapman, MD;  Location: [facility] CATH LAB;  Service: Cardiovascular;  Laterality: N/A;    Current Medications: No outpatient medications have been marked as taking for the 01/31/18 encounter (Office Visit) with [name], NP.     Allergies:   Patient has no  known allergies.   Social History   Socioeconomic History  . Marital status: Married    Spouse name: Not on file  . Number of children: Not on file  . Years of education: Not on file  . Highest education level: Not on file  Occupational History  . Not on file  Social Needs  . Financial resource strain: Not on file  . Food insecurity:    Worry: Not on file    Inability: Not on file  . Transportation needs:    Medical: Not on file    Non-medical: Not on file  Tobacco Use  . Smoking status: Former Smoker    Packs/day: 0.50    Years: 29.00    Pack years: 14.50    Types: Cigarettes    Last attempt to quit: 06/11/2013    Years since quitting: 4.6  . Smokeless tobacco: Never Used  Substance and Sexual Activity  . Alcohol use: Yes    Alcohol/week: 1.0 standard drinks    Types: 1 Cans of beer per week    Comment: 1 can of beer every 1-2 weeks  . Drug use: No    Types: Marijuana    Comment: Last used in 2013  . Sexual activity: Yes  Lifestyle  . Physical activity:    Days per week: Not on file    Minutes per session: Not on file  . Stress: Not on file  Relationships  . Social connections:    Talks on phone: Not on file    Gets together: Not on file    Attends religious service: Not on file    Active member of club or organization: Not on file    Attends meetings of clubs or organizations: Not on file    Relationship status: Not on file  Other Topics Concern  . Not on file  Social History Narrative  . Not on file     Family History: The patient's family history includes Diabetes in his father and mother; Hypertension in his brother, father, and mother. ROS:   Please see the history of present illness.     All other systems reviewed and are negative.  EKGs/Labs/Other Studies Reviewed:    The following studies were reviewed today:  Left Heart Cath and Coronary Angiography 11/28/2016  Conclusion    Mid RCA-2 lesion, 10 %stenosed.  Mid RCA-1 lesion, 10  %stenosed.  Dist RCA lesion, 10 %stenosed.  The left ventricular systolic function is normal.  LV end diastolic pressure is normal.  The left ventricular ejection fraction is greater than 65% by visual estimate.  There is no mitral valve regurgitation.   1. Single vessel CAD with patent stent mid RCA. The stent has minimal restenosis.  2. Evidence of coronary vasospasm during the cardiac cath with spasm noted in the mid RCA proximal to the old stent. This resolved with IC NTG 3. Normal LV function.   Recommendations: Continue medical  management of CAD. Consider titration of his Imdur given evidence of coronary vasospasm.       EKG:  EKG is ordered today.  The ekg ordered today demonstrates NSR, 77 bpm, non-specific ST changes  Recent Labs: 01/31/2018: BUN 14; Creatinine, Ser 1.23; Potassium 4.5; Sodium 135   Recent Lipid Panel    Component Value Date/Time   CHOL 156 01/31/2018 0910   TRIG 49 01/31/2018 0910   HDL 57 01/31/2018 0910   CHOLHDL 2.7 01/31/2018 0910   CHOLHDL 3.2 11/28/2016 0310   VLDL 9 11/28/2016 0310   LDLCALC 89 01/31/2018 0910    Physical Exam:    VS:  BP 132/70   Pulse 77   Ht 6\' 2"  (1.88 m)   Wt 211 lb (95.7 kg)   BMI 27.09 kg/m     Wt Readings from Last 3 Encounters:  01/31/18 211 lb (95.7 kg)  12/06/16 206 lb 1.9 oz (93.5 kg)  11/28/16 203 lb 3.2 oz (92.2 kg)     Physical Exam  Constitutional: He is oriented to person, place, and time. He appears well-developed and well-nourished. No distress.  HENT:  Head: Normocephalic and atraumatic.  Neck: Normal range of motion. Neck supple. No JVD present.  Cardiovascular: Normal rate, regular rhythm, normal heart sounds and intact distal pulses. Exam reveals no gallop and no friction rub.  No murmur heard. Pulmonary/Chest: Effort normal and breath sounds normal. No respiratory distress. He has no wheezes. He has no rales.  Abdominal: Soft. Bowel sounds are normal. He exhibits no distension.    Musculoskeletal: Normal range of motion. He exhibits no edema or deformity.  Neurological: He is alert and oriented to person, place, and time.  Skin: Skin is warm and dry.  Psychiatric: He has a normal mood and affect. His behavior is normal. Judgment and thought content normal.  Vitals reviewed.   ASSESSMENT:    1. Atypical chest pain   2. Coronary artery disease involving native coronary artery of native heart with angina pectoris with documented spasm (HCC)   3. Essential hypertension   4. Mixed hyperlipidemia    PLAN:    In order of problems listed above:  Atypical chest pain: -Brief occasional stabbing left sided chest pains, no shortness of breath. No exertional CP or dyspnea but he is activity intolerant. He also has had constant left upper arm/shoulder pain for a month. He does repetitive movements with his left arm at work. Also has right upper jaw pain aggravated by moving his jaw.  EKG with non-specific ST changes.  -His symptoms are mostly atypical but with his history and his increased concern, I will check troponin today. If positive he will need to go to the hosptial for cath. If negative will check exercise myoview for myocardial perfusion and exercise tolerance. I have explained to him that if his stress test is abnormal he would need a cardiac cath. We also reviewed use of SL NTG and alarm symptoms, when to call 911.   CAD: signs of vasospasm at cardiac catheterization in 11/2016, on Imdur, aspirin 81 mg, Plavix 75 mg daily, beta-blocker, statin. Continue current therapy and follow up after stress test.   Hypertension: BP well controlled. Continue current therapy.   Hyperlipidemia: Last lipid panel in 11/2016 with LDL 84. Will update lipids today.      Medication Adjustments/Labs and Tests Ordered: Current medicines are reviewed at length with the patient today.  Concerns regarding medicines are outlined above. Labs and tests ordered and medication changes  are  outlined in the patient instructions below:  Patient Instructions  Medication Instructions: Your physician recommends that you continue on your current medications as directed. Please refer to the Current Medication list given to you today.   Labwork: TODAY: BMET, LIPIDS, TROPONIN  Procedures/Testing: Your physician has requested that you have en exercise stress myoview. For further information please visit [email or web address]. Please follow instruction sheet, as given.    Follow-Up: Your physician recommends that you schedule a follow-up appointment in: 3 months with Dr.Mcalhany    Any Additional Special Instructions Will Be Listed Below (If Applicable).  DASH Eating Plan DASH stands for "Dietary Approaches to Stop Hypertension." The DASH eating plan is a healthy eating plan that has been shown to reduce high blood pressure (hypertension). It may also reduce your risk for type 2 diabetes, heart disease, and stroke. The DASH eating plan may also help with weight loss. What are tips for following this plan? General guidelines  Avoid eating more than 2,300 mg (milligrams) of salt (sodium) a day. If you have hypertension, you may need to reduce your sodium intake to 1,500 mg a day.  Limit alcohol intake to no more than 1 drink a day for nonpregnant women and 2 drinks a day for men. One drink equals 12 oz of beer, 5 oz of wine, or 1 oz of hard liquor.  Work with your health care provider to maintain a healthy body weight or to lose weight. Ask what an ideal weight is for you.  Get at least 30 minutes of exercise that causes your heart to beat faster (aerobic exercise) most days of the week. Activities may include walking, swimming, or biking.  Work with your health care provider or diet and nutrition specialist (dietitian) to adjust your eating plan to your individual calorie needs. Reading food labels  Check food labels for the amount of sodium per serving. Choose foods with less  than 5 percent of the Daily Value of sodium. Generally, foods with less than 300 mg of sodium per serving fit into this eating plan.  To find whole grains, look for the word "whole" as the first word in the ingredient list. Shopping  Buy products labeled as "low-sodium" or "no salt added."  Buy fresh foods. Avoid canned foods and premade or frozen meals. Cooking  Avoid adding salt when cooking. Use salt-free seasonings or herbs instead of table salt or sea salt. Check with your health care provider or pharmacist before using salt substitutes.  Do not fry foods. Cook foods using healthy methods such as baking, boiling, grilling, and broiling instead.  Cook with heart-healthy oils, such as olive, canola, soybean, or sunflower oil. Meal planning   Eat a balanced diet that includes: ? 5 or more servings of fruits and vegetables each day. At each meal, try to fill half of your plate with fruits and vegetables. ? Up to 6-8 servings of whole grains each day. ? Less than 6 oz of lean meat, poultry, or fish each day. A 3-oz serving of meat is about the same size as a deck of cards. One egg equals 1 oz. ? 2 servings of low-fat dairy each day. ? A serving of nuts, seeds, or beans 5 times each week. ? Heart-healthy fats. Healthy fats called Omega-3 fatty acids are found in foods such as flaxseeds and coldwater fish, like sardines, salmon, and mackerel.  Limit how much you eat of the following: ? Canned or prepackaged foods. ? Food that is high in trans  fat, such as fried foods. ? Food that is high in saturated fat, such as fatty meat. ? Sweets, desserts, sugary drinks, and other foods with added sugar. ? Full-fat dairy products.  Do not salt foods before eating.  Try to eat at least 2 vegetarian meals each week.  Eat more home-cooked food and less restaurant, buffet, and fast food.  When eating at a restaurant, ask that your food be prepared with less salt or no salt, if possible. What  foods are recommended? The items listed may not be a complete list. Talk with your dietitian about what dietary choices are best for you. Grains Whole-grain or whole-wheat bread. Whole-grain or whole-wheat pasta. Brown rice. Orpah Cobb. Bulgur. Whole-grain and low-sodium cereals. Pita bread. Low-fat, low-sodium crackers. Whole-wheat flour tortillas. Vegetables Fresh or frozen vegetables (raw, steamed, roasted, or grilled). Low-sodium or reduced-sodium tomato and vegetable juice. Low-sodium or reduced-sodium tomato sauce and tomato paste. Low-sodium or reduced-sodium canned vegetables. Fruits All fresh, dried, or frozen fruit. Canned fruit in natural juice (without added sugar). Meat and other protein foods Skinless chicken or [place]. Ground chicken or [place]. Pork with fat trimmed off. Fish and seafood. Egg whites. Dried beans, peas, or lentils. Unsalted nuts, nut butters, and seeds. Unsalted canned beans. Lean cuts of beef with fat trimmed off. Low-sodium, lean deli meat. Dairy Low-fat (1%) or fat-free (skim) milk. Fat-free, low-fat, or reduced-fat cheeses. Nonfat, low-sodium ricotta or cottage cheese. Low-fat or nonfat yogurt. Low-fat, low-sodium cheese. Fats and oils Soft margarine without trans fats. Vegetable oil. Low-fat, reduced-fat, or light mayonnaise and salad dressings (reduced-sodium). Canola, safflower, olive, soybean, and sunflower oils. Avocado. Seasoning and other foods Herbs. Spices. Seasoning mixes without salt. Unsalted popcorn and pretzels. Fat-free sweets. What foods are not recommended? The items listed may not be a complete list. Talk with your dietitian about what dietary choices are best for you. Grains Baked goods made with fat, such as croissants, muffins, or some breads. Dry pasta or rice meal packs. Vegetables Creamed or fried vegetables. Vegetables in a cheese sauce. Regular canned vegetables (not low-sodium or reduced-sodium). Regular canned tomato sauce and  paste (not low-sodium or reduced-sodium). Regular tomato and vegetable juice (not low-sodium or reduced-sodium). Rosita Fire. Olives. Fruits Canned fruit in a light or heavy syrup. Fried fruit. Fruit in cream or butter sauce. Meat and other protein foods Fatty cuts of meat. Ribs. Fried meat. Tomasa Blase. Sausage. Bologna and other processed lunch meats. Salami. Fatback. Hotdogs. Bratwurst. Salted nuts and seeds. Canned beans with added salt. Canned or smoked fish. Whole eggs or egg yolks. Chicken or [place] with skin. Dairy Whole or 2% milk, cream, and half-and-half. Whole or full-fat cream cheese. Whole-fat or sweetened yogurt. Full-fat cheese. Nondairy creamers. Whipped toppings. Processed cheese and cheese spreads. Fats and oils Butter. Stick margarine. Lard. Shortening. Ghee. Bacon fat. Tropical oils, such as coconut, palm kernel, or palm oil. Seasoning and other foods Salted popcorn and pretzels. Onion salt, garlic salt, seasoned salt, table salt, and sea salt. Worcestershire sauce. Tartar sauce. Barbecue sauce. Teriyaki sauce. Soy sauce, including reduced-sodium. Steak sauce. Canned and packaged gravies. Fish sauce. Oyster sauce. Cocktail sauce. Horseradish that you find on the shelf. Ketchup. Mustard. Meat flavorings and tenderizers. Bouillon cubes. Hot sauce and Tabasco sauce. Premade or packaged marinades. Premade or packaged taco seasonings. Relishes. Regular salad dressings. Where to find more information:  National Heart, Lung, and Blood Institute: PopSteam.is  American Heart Association: www.heart.org Summary  The DASH eating plan is a healthy eating plan that has  been shown to reduce high blood pressure (hypertension). It may also reduce your risk for type 2 diabetes, heart disease, and stroke.  With the DASH eating plan, you should limit salt (sodium) intake to 2,300 mg a day. If you have hypertension, you may need to reduce your sodium intake to 1,500 mg a day.  When on the DASH  eating plan, aim to eat more fresh fruits and vegetables, whole grains, lean proteins, low-fat dairy, and heart-healthy fats.  Work with your health care provider or diet and nutrition specialist (dietitian) to adjust your eating plan to your individual calorie needs. This information is not intended to replace advice given to you by your health care provider. Make sure you discuss any questions you have with your health care provider. Document Released: 05/17/2011 Document Revised: 05/21/2016 Document Reviewed: 05/21/2016 Elsevier Interactive Patient Education  [employer]2018 [employer].     If you need a refill on your cardiac medications before your next appointment, please call your pharmacy.      Signed, Berton BonJanine Brietta Manso, NP  01/31/2018 5:04 PM    [facility]

## 2018-01-31 ENCOUNTER — Ambulatory Visit: Payer: 59 | Admitting: Cardiology

## 2018-01-31 ENCOUNTER — Encounter: Payer: Self-pay | Admitting: Cardiology

## 2018-01-31 ENCOUNTER — Encounter (INDEPENDENT_AMBULATORY_CARE_PROVIDER_SITE_OTHER): Payer: Self-pay

## 2018-01-31 VITALS — BP 132/70 | HR 77 | Ht 74.0 in | Wt 211.0 lb

## 2018-01-31 DIAGNOSIS — R0789 Other chest pain: Secondary | ICD-10-CM | POA: Diagnosis not present

## 2018-01-31 DIAGNOSIS — E782 Mixed hyperlipidemia: Secondary | ICD-10-CM

## 2018-01-31 DIAGNOSIS — I25111 Atherosclerotic heart disease of native coronary artery with angina pectoris with documented spasm: Secondary | ICD-10-CM

## 2018-01-31 DIAGNOSIS — I10 Essential (primary) hypertension: Secondary | ICD-10-CM | POA: Diagnosis not present

## 2018-01-31 LAB — BASIC METABOLIC PANEL
BUN / CREAT RATIO: 11 (ref 9–20)
BUN: 14 mg/dL (ref 6–24)
CO2: 22 mmol/L (ref 20–29)
CREATININE: 1.23 mg/dL (ref 0.76–1.27)
Calcium: 9.6 mg/dL (ref 8.7–10.2)
Chloride: 99 mmol/L (ref 96–106)
GFR calc Af Amer: 79 mL/min/{1.73_m2} (ref >59)
GFR, EST NON AFRICAN AMERICAN: 68 mL/min/{1.73_m2} (ref >59)
Glucose: 101 mg/dL — ABNORMAL HIGH (ref 65–99)
POTASSIUM: 4.5 mmol/L (ref 3.5–5.2)
SODIUM: 135 mmol/L (ref 134–144)

## 2018-01-31 LAB — LIPID PANEL
CHOL/HDL RATIO: 2.7 ratio (ref 0.0–5.0)
Cholesterol, Total: 156 mg/dL (ref 100–199)
HDL: 57 mg/dL (ref >39)
LDL CALC: 89 mg/dL (ref 0–99)
Triglycerides: 49 mg/dL (ref 0–149)
VLDL CHOLESTEROL CAL: 10 mg/dL (ref 5–40)

## 2018-01-31 LAB — TROPONIN T: Troponin T TROPT: <0.011 ng/mL (ref <0.011)

## 2018-01-31 MED ORDER — NITROGLYCERIN 0.4 MG SL SUBL: 0.4000 mg | SUBLINGUAL_TABLET | SUBLINGUAL | 3 refills | Status: AC | PRN

## 2018-01-31 NOTE — Patient Instructions (Addendum)
Medication Instructions: Your physician recommends that you continue on your current medications as directed. Please refer to the Current Medication list given to you today.   Labwork: TODAY: BMET, LIPIDS, TROPONIN  Procedures/Testing: Your physician has requested that you have en exercise stress myoview. For further information please visit [email or web address]. Please follow instruction sheet, as given.    Follow-Up: Your physician recommends that you schedule a follow-up appointment in: 3 months with Dr.Mcalhany    Any Additional Special Instructions Will Be Listed Below (If Applicable).  DASH Eating Plan DASH stands for "Dietary Approaches to Stop Hypertension." The DASH eating plan is a healthy eating plan that has been shown to reduce high blood pressure (hypertension). It may also reduce your risk for type 2 diabetes, heart disease, and stroke. The DASH eating plan may also help with weight loss. What are tips for following this plan? General guidelines  Avoid eating more than 2,300 mg (milligrams) of salt (sodium) a day. If you have hypertension, you may need to reduce your sodium intake to 1,500 mg a day.  Limit alcohol intake to no more than 1 drink a day for nonpregnant women and 2 drinks a day for men. One drink equals 12 oz of beer, 5 oz of wine, or 1 oz of hard liquor.  Work with your health care provider to maintain a healthy body weight or to lose weight. Ask what an ideal weight is for you.  Get at least 30 minutes of exercise that causes your heart to beat faster (aerobic exercise) most days of the week. Activities may include walking, swimming, or biking.  Work with your health care provider or diet and nutrition specialist (dietitian) to adjust your eating plan to your individual calorie needs. Reading food labels  Check food labels for the amount of sodium per serving. Choose foods with less than 5 percent of the Daily Value of sodium. Generally, foods with less  than 300 mg of sodium per serving fit into this eating plan.  To find whole grains, look for the word "whole" as the first word in the ingredient list. Shopping  Buy products labeled as "low-sodium" or "no salt added."  Buy fresh foods. Avoid canned foods and premade or frozen meals. Cooking  Avoid adding salt when cooking. Use salt-free seasonings or herbs instead of table salt or sea salt. Check with your health care provider or pharmacist before using salt substitutes.  Do not fry foods. Cook foods using healthy methods such as baking, boiling, grilling, and broiling instead.  Cook with heart-healthy oils, such as olive, canola, soybean, or sunflower oil. Meal planning   Eat a balanced diet that includes: ? 5 or more servings of fruits and vegetables each day. At each meal, try to fill half of your plate with fruits and vegetables. ? Up to 6-8 servings of whole grains each day. ? Less than 6 oz of lean meat, poultry, or fish each day. A 3-oz serving of meat is about the same size as a deck of cards. One egg equals 1 oz. ? 2 servings of low-fat dairy each day. ? A serving of nuts, seeds, or beans 5 times each week. ? Heart-healthy fats. Healthy fats called Omega-3 fatty acids are found in foods such as flaxseeds and coldwater fish, like sardines, salmon, and mackerel.  Limit how much you eat of the following: ? Canned or prepackaged foods. ? Food that is high in trans fat, such as fried foods. ? Food that is high in saturated  fat, such as fatty meat. ? Sweets, desserts, sugary drinks, and other foods with added sugar. ? Full-fat dairy products.  Do not salt foods before eating.  Try to eat at least 2 vegetarian meals each week.  Eat more home-cooked food and less restaurant, buffet, and fast food.  When eating at a restaurant, ask that your food be prepared with less salt or no salt, if possible. What foods are recommended? The items listed may not be a complete list. Talk  with your dietitian about what dietary choices are best for you. Grains Whole-grain or whole-wheat bread. Whole-grain or whole-wheat pasta. Brown rice. Orpah Cobb. Bulgur. Whole-grain and low-sodium cereals. Pita bread. Low-fat, low-sodium crackers. Whole-wheat flour tortillas. Vegetables Fresh or frozen vegetables (raw, steamed, roasted, or grilled). Low-sodium or reduced-sodium tomato and vegetable juice. Low-sodium or reduced-sodium tomato sauce and tomato paste. Low-sodium or reduced-sodium canned vegetables. Fruits All fresh, dried, or frozen fruit. Canned fruit in natural juice (without added sugar). Meat and other protein foods Skinless chicken or [place]. Ground chicken or [place]. Pork with fat trimmed off. Fish and seafood. Egg whites. Dried beans, peas, or lentils. Unsalted nuts, nut butters, and seeds. Unsalted canned beans. Lean cuts of beef with fat trimmed off. Low-sodium, lean deli meat. Dairy Low-fat (1%) or fat-free (skim) milk. Fat-free, low-fat, or reduced-fat cheeses. Nonfat, low-sodium ricotta or cottage cheese. Low-fat or nonfat yogurt. Low-fat, low-sodium cheese. Fats and oils Soft margarine without trans fats. Vegetable oil. Low-fat, reduced-fat, or light mayonnaise and salad dressings (reduced-sodium). Canola, safflower, olive, soybean, and sunflower oils. Avocado. Seasoning and other foods Herbs. Spices. Seasoning mixes without salt. Unsalted popcorn and pretzels. Fat-free sweets. What foods are not recommended? The items listed may not be a complete list. Talk with your dietitian about what dietary choices are best for you. Grains Baked goods made with fat, such as croissants, muffins, or some breads. Dry pasta or rice meal packs. Vegetables Creamed or fried vegetables. Vegetables in a cheese sauce. Regular canned vegetables (not low-sodium or reduced-sodium). Regular canned tomato sauce and paste (not low-sodium or reduced-sodium). Regular tomato and vegetable  juice (not low-sodium or reduced-sodium). Rosita Fire. Olives. Fruits Canned fruit in a light or heavy syrup. Fried fruit. Fruit in cream or butter sauce. Meat and other protein foods Fatty cuts of meat. Ribs. Fried meat. Tomasa Blase. Sausage. Bologna and other processed lunch meats. Salami. Fatback. Hotdogs. Bratwurst. Salted nuts and seeds. Canned beans with added salt. Canned or smoked fish. Whole eggs or egg yolks. Chicken or [place] with skin. Dairy Whole or 2% milk, cream, and half-and-half. Whole or full-fat cream cheese. Whole-fat or sweetened yogurt. Full-fat cheese. Nondairy creamers. Whipped toppings. Processed cheese and cheese spreads. Fats and oils Butter. Stick margarine. Lard. Shortening. Ghee. Bacon fat. Tropical oils, such as coconut, palm kernel, or palm oil. Seasoning and other foods Salted popcorn and pretzels. Onion salt, garlic salt, seasoned salt, table salt, and sea salt. Worcestershire sauce. Tartar sauce. Barbecue sauce. Teriyaki sauce. Soy sauce, including reduced-sodium. Steak sauce. Canned and packaged gravies. Fish sauce. Oyster sauce. Cocktail sauce. Horseradish that you find on the shelf. Ketchup. Mustard. Meat flavorings and tenderizers. Bouillon cubes. Hot sauce and Tabasco sauce. Premade or packaged marinades. Premade or packaged taco seasonings. Relishes. Regular salad dressings. Where to find more information:  National Heart, Lung, and Blood Institute: PopSteam.is  American Heart Association: www.heart.org Summary  The DASH eating plan is a healthy eating plan that has been shown to reduce high blood pressure (hypertension). It may also reduce  your risk for type 2 diabetes, heart disease, and stroke.  With the DASH eating plan, you should limit salt (sodium) intake to 2,300 mg a day. If you have hypertension, you may need to reduce your sodium intake to 1,500 mg a day.  When on the DASH eating plan, aim to eat more fresh fruits and vegetables, whole grains,  lean proteins, low-fat dairy, and heart-healthy fats.  Work with your health care provider or diet and nutrition specialist (dietitian) to adjust your eating plan to your individual calorie needs. This information is not intended to replace advice given to you by your health care provider. Make sure you discuss any questions you have with your health care provider. Document Released: 05/17/2011 Document Revised: 05/21/2016 Document Reviewed: 05/21/2016 Elsevier Interactive Patient Education  [employer].     If you need a refill on your cardiac medications before your next appointment, please call your pharmacy.

## 2018-02-03 ENCOUNTER — Telehealth (HOSPITAL_COMMUNITY): Payer: Self-pay | Admitting: *Deleted

## 2018-02-03 NOTE — Telephone Encounter (Signed)
Left message on voicemail in reference to upcoming appointment scheduled for 02/05/18. Phone number given for a call back so details instructions can be given. Andros Channing, Adelene IdlerCynthia W

## 2018-02-03 NOTE — Telephone Encounter (Signed)
Patient given detailed instructions per Myocardial Perfusion Study Information Sheet for the test on 02/05/18 at 10:0. Patient notified to arrive 15 minutes early and that it is imperative to arrive on time for appointment to keep from having the test rescheduled.  If you need to cancel or reschedule your appointment, please call the office within 24 hours of your appointment. . Patient verbalized understanding.Daneil DolinSharon S Brooks

## 2018-02-05 ENCOUNTER — Ambulatory Visit (HOSPITAL_COMMUNITY): Payer: 59 | Attending: Cardiovascular Disease

## 2018-02-05 DIAGNOSIS — R0789 Other chest pain: Secondary | ICD-10-CM

## 2018-02-05 DIAGNOSIS — I10 Essential (primary) hypertension: Secondary | ICD-10-CM | POA: Diagnosis not present

## 2018-02-05 LAB — MYOCARDIAL PERFUSION IMAGING
CHL CUP MPHR: 170 {beats}/min
Estimated workload: 8.5 METS
Exercise duration (min): 7 min
Exercise duration (sec): 0 s
LVDIAVOL: 73 mL (ref 62–150)
LVSYSVOL: 26 mL
NUC STRESS TID: 0.88
Peak HR: 160 {beats}/min
Percent HR: 94 %
Rest HR: 69 {beats}/min
SDS: 0
SRS: 0
SSS: 0

## 2018-02-05 MED ORDER — TECHNETIUM TC 99M TETROFOSMIN IV KIT
31.4000 | PACK | Freq: Once | INTRAVENOUS | Status: AC | PRN
  Administered 2018-02-05: 31.4 via INTRAVENOUS
  Filled 2018-02-05: qty 32

## 2018-02-05 MED ORDER — TECHNETIUM TC 99M TETROFOSMIN IV KIT
10.6000 | PACK | Freq: Once | INTRAVENOUS | Status: AC | PRN
  Administered 2018-02-05: 10.6 via INTRAVENOUS
  Filled 2018-02-05: qty 11

## 2018-04-21 ENCOUNTER — Encounter: Payer: Self-pay | Admitting: Cardiovascular Disease

## 2018-05-12 ENCOUNTER — Ambulatory Visit: Payer: 59 | Admitting: Cardiovascular Disease

## 2018-05-12 ENCOUNTER — Encounter: Payer: Self-pay | Admitting: Cardiovascular Disease

## 2018-05-12 DIAGNOSIS — R0989 Other specified symptoms and signs involving the circulatory and respiratory systems: Secondary | ICD-10-CM

## 2018-05-12 NOTE — Progress Notes (Deleted)
No chief complaint on file.   History of Present Illness: 52 yo male with history of CAD, HTN, hyperlipidemia, DM and GERD here today for cardiac follow up. He has been followed in the past by Dr. Eden EmmsNishan. He had a NSTEMI in 2013 and had a drug eluting stent placed in the RCA. Cardiac cath in 2015 with severe restenosis in the RCA stent that was treated with another drug eluting stent. Most recent cardiac cath in June 2018 with patent stent mid RCA with minimal restenosis and no other disease in the the LAD or Circumflex. LV systolic function normal by cath. Coronary spasm noted during cath. Imdur was started. Normal stress test in August 2019. I have not seen him in the office prior to today.   He is here today for follow up. The patient denies any chest pain, dyspnea, palpitations, lower extremity edema, orthopnea, PND, dizziness, near syncope or syncope.   Primary Care Physician: Patient, No Pcp Per   Past Medical History:  Diagnosis Date  . Anginal pain (HCC)   . CAD (coronary artery disease) 01/25/2012   a. RCA ptca/des 2013 with NSTEMI b. restenosis of RCA in 07/2013 w/ DES placed c. cath 06/17/2014 negative cath, patent stent in RCA with minimal restenosis  d. LHC 11/28/16- no change  . Carotid bruit   . Coronary artery spasm (HCC) 11/28/2016   See on cath 11/28/16  . High cholesterol   . Hypertension   . Sciatica   . Sinus headache    "weekly" (07/24/2013)  . Type II diabetes mellitus (HCC) dx'd 2013    Past Surgical History:  Procedure Laterality Date  . CARDIAC CATHETERIZATION  07/24/2013  . CARDIAC CATHETERIZATION N/A 06/17/2015   Procedure: Left Heart Cath and Coronary Angiography;  Surgeon: Kathleene Hazelhristopher D Mansour Balboa, MD;  [facility];  Service: Cardiovascular;  Laterality: N/A;  . CORONARY ANGIOPLASTY WITH STENT PLACEMENT  01/2012   "1"  . INGUINAL HERNIA REPAIR  ~ 1985   left  . LEFT HEART CATH AND CORONARY ANGIOGRAPHY N/A 11/28/2016   Procedure: Left Heart  Cath and Coronary Angiography;  Surgeon: Kathleene HazelMcAlhany, Jahnay Lantier D, MD;  [facility];  Service: Cardiovascular;  Laterality: N/A;  . LEFT HEART CATHETERIZATION WITH CORONARY ANGIOGRAM N/A 01/26/2012   Procedure: LEFT HEART CATHETERIZATION WITH CORONARY ANGIOGRAM;  Surgeon: Lesleigh NoeHenry W Smith III, MD;  [facility];  Service: Cardiovascular;  Laterality: N/A;  . LEFT HEART CATHETERIZATION WITH CORONARY ANGIOGRAM N/A 07/24/2013   Procedure: LEFT HEART CATHETERIZATION WITH CORONARY ANGIOGRAM;  Surgeon: Runell GessJonathan J Berry, MD;  [facility];  Service: Cardiovascular;  Laterality: N/A;  . PERCUTANEOUS CORONARY STENT INTERVENTION (PCI-S) N/A 07/27/2013   Procedure: PERCUTANEOUS CORONARY STENT INTERVENTION (PCI-S);  Surgeon: Micheline ChapmanMichael D Cooper, MD;  [facility];  Service: Cardiovascular;  Laterality: N/A;    Current Outpatient Medications  Medication Sig Dispense Refill  . aspirin 81 MG tablet Take 81 mg by mouth daily.    Marland Kitchen. atorvastatin (LIPITOR) 20 MG tablet Take 20 mg by mouth at bedtime.    . clopidogrel (PLAVIX) 75 MG tablet Take 75 mg by mouth daily with breakfast.    . isosorbide mononitrate (IMDUR) 60 MG 24 hr tablet Take 1 tablet (60 mg total) by mouth every morning. 30 tablet 11  . lisinopril-hydrochlorothiazide (PRINZIDE,ZESTORETIC) 20-12.5 MG tablet Take 1 tablet by mouth daily. 90 tablet 3  . metFORMIN (GLUCOPHAGE) 500 MG tablet Take 500 mg by mouth at bedtime.    .Marland Kitchen  metoprolol tartrate (LOPRESSOR) 25 MG tablet Take 25 mg by mouth 2 (two) times daily.    . nitroGLYCERIN (NITROSTAT) 0.4 MG SL tablet Place 1 tablet (0.4 mg total) under the tongue every 5 (five) minutes as needed for chest pain. 25 tablet 3  . pantoprazole (PROTONIX) 20 MG tablet Take 20 mg by mouth 2 (two) times daily.     No current facility-administered medications for this visit.     No Known Allergies  Social History   Socioeconomic History  . Marital status: Married    Spouse name:  Not on file  . Number of children: Not on file  . Years of education: Not on file  . Highest education level: Not on file  Occupational History  . Not on file  Social Needs  . Financial resource strain: Not on file  . Food insecurity:    Worry: Not on file    Inability: Not on file  . Transportation needs:    Medical: Not on file    Non-medical: Not on file  Tobacco Use  . Smoking status: Former Smoker    Packs/day: 0.50    Years: 29.00    Pack years: 14.50    Types: Cigarettes    Last attempt to quit: 06/11/2013    Years since quitting: 4.9  . Smokeless tobacco: Never Used  Substance and Sexual Activity  . Alcohol use: Yes    Alcohol/week: 1.0 standard drinks    Types: 1 Cans of beer per week    Comment: 1 can of beer every 1-2 weeks  . Drug use: No    Types: Marijuana    Comment: Last used in 2013  . Sexual activity: Yes  Lifestyle  . Physical activity:    Days per week: Not on file    Minutes per session: Not on file  . Stress: Not on file  Relationships  . Social connections:    Talks on phone: Not on file    Gets together: Not on file    Attends religious service: Not on file    Active member of club or organization: Not on file    Attends meetings of clubs or organizations: Not on file    Relationship status: Not on file  . Intimate partner violence:    Fear of current or ex partner: Not on file    Emotionally abused: Not on file    Physically abused: Not on file    Forced sexual activity: Not on file  Other Topics Concern  . Not on file  Social History Narrative  . Not on file    Family History  Problem Relation Age of Onset  . Hypertension Mother   . Diabetes Mother   . Hypertension Father   . Diabetes Father   . Hypertension Brother     Review of Systems:  As stated in the HPI and otherwise negative.   There were no vitals taken for this visit.  Physical Examination: General: Well developed, well nourished, NAD  HEENT: OP clear, mucus  membranes moist  SKIN: warm, dry. No rashes. Neuro: No focal deficits  Musculoskeletal: Muscle strength 5/5 all ext  Psychiatric: Mood and affect normal  Neck: No JVD, no carotid bruits, no thyromegaly, no lymphadenopathy.  Lungs:Clear bilaterally, no wheezes, rhonci, crackles Cardiovascular: Regular rate and rhythm. No murmurs, gallops or rubs. Abdomen:Soft. Bowel sounds present. Non-tender.  Extremities: No lower extremity edema. Pulses are 2 + in the bilateral DP/PT.  EKG:  EKG {ACTION; IS/IS  [id number]} ordered today. The ekg ordered today demonstrates ***  Recent Labs: 01/31/2018: BUN 14; Creatinine, Ser 1.23; Potassium 4.5; Sodium 135   Lipid Panel    Component Value Date/Time   CHOL 156 01/31/2018 0910   TRIG 49 01/31/2018 0910   HDL 57 01/31/2018 0910   CHOLHDL 2.7 01/31/2018 0910   CHOLHDL 3.2 11/28/2016 0310   VLDL 9 11/28/2016 0310   LDLCALC 89 01/31/2018 0910     Wt Readings from Last 3 Encounters:  02/05/18 211 lb (95.7 kg)  01/31/18 211 lb (95.7 kg)  12/06/16 206 lb 1.9 oz (93.5 kg)     Assessment and Plan:   1. CAD with angina: No chest pain on Imdur. Last cath in 2018 with patent RCA stent and no other disease noted. Nuclear stress test in August 2019 with no ischemia. Will continue ASA, Plavix, Imdur, statin and beta blocker.   2. HTN: BP is controlled. No changes  3. Hyperlipidemia: LDL near goal August 2019. Continue statin.   Current medicines are reviewed at length with the patient today.  The patient does not have concerns regarding medicines.  The following changes have been made:  no change  Labs/ tests ordered today include:  No orders of the defined types were placed in this encounter.    Disposition:   FU with me in 12 months   Signed, Verne Carrow, MD 05/12/2018 8:55 AM    [facility] [address] [place], [place], [place]  [address] Phone: [phone]; Fax: [phone]

## 2020-12-29 ENCOUNTER — Inpatient Hospital Stay (HOSPITAL_COMMUNITY)
Admission: EM | Admit: 2020-12-29 | Discharge: 2021-01-03 | DRG: 314 | Disposition: A | Discharge status: Home / Self Care | Payer: 59 | Attending: Cardiovascular Disease | Admitting: Cardiovascular Disease

## 2020-12-29 ENCOUNTER — Emergency Department (HOSPITAL_COMMUNITY): Payer: 59

## 2020-12-29 ENCOUNTER — Encounter (HOSPITAL_COMMUNITY): Payer: Self-pay | Admitting: Radiology

## 2020-12-29 ENCOUNTER — Other Ambulatory Visit: Payer: Self-pay

## 2020-12-29 DIAGNOSIS — I3139 Other pericardial effusion (noninflammatory): Secondary | ICD-10-CM

## 2020-12-29 DIAGNOSIS — I10 Essential (primary) hypertension: Secondary | ICD-10-CM | POA: Diagnosis present

## 2020-12-29 DIAGNOSIS — J9600 Acute respiratory failure, unspecified whether with hypoxia or hypercapnia: Secondary | ICD-10-CM | POA: Diagnosis not present

## 2020-12-29 DIAGNOSIS — Z7984 Long term (current) use of oral hypoglycemic drugs: Secondary | ICD-10-CM

## 2020-12-29 DIAGNOSIS — R7401 Elevation of levels of liver transaminase levels: Secondary | ICD-10-CM

## 2020-12-29 DIAGNOSIS — A419 Sepsis, unspecified organism: Secondary | ICD-10-CM | POA: Diagnosis not present

## 2020-12-29 DIAGNOSIS — I300 Acute nonspecific idiopathic pericarditis: Secondary | ICD-10-CM

## 2020-12-29 DIAGNOSIS — Z7982 Long term (current) use of aspirin: Secondary | ICD-10-CM

## 2020-12-29 DIAGNOSIS — Z833 Family history of diabetes mellitus: Secondary | ICD-10-CM

## 2020-12-29 DIAGNOSIS — Z789 Other specified health status: Secondary | ICD-10-CM

## 2020-12-29 DIAGNOSIS — Z7902 Long term (current) use of antithrombotics/antiplatelets: Secondary | ICD-10-CM

## 2020-12-29 DIAGNOSIS — E119 Type 2 diabetes mellitus without complications: Secondary | ICD-10-CM | POA: Diagnosis present

## 2020-12-29 DIAGNOSIS — Z8249 Family history of ischemic heart disease and other diseases of the circulatory system: Secondary | ICD-10-CM

## 2020-12-29 DIAGNOSIS — R6520 Severe sepsis without septic shock: Secondary | ICD-10-CM | POA: Diagnosis not present

## 2020-12-29 DIAGNOSIS — Z79899 Other long term (current) drug therapy: Secondary | ICD-10-CM

## 2020-12-29 DIAGNOSIS — I313 Pericardial effusion (noninflammatory): Secondary | ICD-10-CM | POA: Diagnosis not present

## 2020-12-29 DIAGNOSIS — Z20822 Contact with and (suspected) exposure to covid-19: Secondary | ICD-10-CM | POA: Diagnosis present

## 2020-12-29 DIAGNOSIS — I2510 Atherosclerotic heart disease of native coronary artery without angina pectoris: Secondary | ICD-10-CM | POA: Diagnosis present

## 2020-12-29 DIAGNOSIS — Z87891 Personal history of nicotine dependence: Secondary | ICD-10-CM

## 2020-12-29 DIAGNOSIS — E782 Mixed hyperlipidemia: Secondary | ICD-10-CM | POA: Diagnosis present

## 2020-12-29 DIAGNOSIS — E785 Hyperlipidemia, unspecified: Secondary | ICD-10-CM | POA: Diagnosis present

## 2020-12-29 DIAGNOSIS — K219 Gastro-esophageal reflux disease without esophagitis: Secondary | ICD-10-CM | POA: Diagnosis present

## 2020-12-29 DIAGNOSIS — I314 Cardiac tamponade: Secondary | ICD-10-CM

## 2020-12-29 DIAGNOSIS — J189 Pneumonia, unspecified organism: Secondary | ICD-10-CM | POA: Diagnosis present

## 2020-12-29 DIAGNOSIS — E871 Hypo-osmolality and hyponatremia: Secondary | ICD-10-CM | POA: Diagnosis present

## 2020-12-29 DIAGNOSIS — E7800 Pure hypercholesterolemia, unspecified: Secondary | ICD-10-CM | POA: Diagnosis present

## 2020-12-29 DIAGNOSIS — I252 Old myocardial infarction: Secondary | ICD-10-CM

## 2020-12-29 LAB — CBC WITH DIFFERENTIAL/PLATELET
Abs Immature Granulocytes: 0.21 10*3/uL — ABNORMAL HIGH (ref 0.00–0.07)
Basophils Absolute: 0.1 10*3/uL (ref 0.0–0.1)
Basophils Relative: 0 %
Eosinophils Absolute: 0 10*3/uL (ref 0.0–0.5)
Eosinophils Relative: 0 %
HCT: 44.1 % (ref 39.0–52.0)
Hemoglobin: 14.1 g/dL (ref 13.0–17.0)
Immature Granulocytes: 1 %
Lymphocytes Relative: 8 %
Lymphs Abs: 1.4 10*3/uL (ref 0.7–4.0)
MCH: 21.5 pg — ABNORMAL LOW (ref 26.0–34.0)
MCHC: 32 g/dL (ref 30.0–36.0)
MCV: 67.2 fL — ABNORMAL LOW (ref 80.0–100.0)
Monocytes Absolute: 1.6 10*3/uL — ABNORMAL HIGH (ref 0.1–1.0)
Monocytes Relative: 9 %
Neutro Abs: 14.2 10*3/uL — ABNORMAL HIGH (ref 1.7–7.7)
Neutrophils Relative %: 82 %
Platelets: 804 10*3/uL — ABNORMAL HIGH (ref 150–400)
RBC: 6.56 MIL/uL — ABNORMAL HIGH (ref 4.22–5.81)
RDW: 17.6 % — ABNORMAL HIGH (ref 11.5–15.5)
WBC: 17.5 10*3/uL — ABNORMAL HIGH (ref 4.0–10.5)
nRBC: 0 % (ref 0.0–0.2)

## 2020-12-29 LAB — COMPREHENSIVE METABOLIC PANEL
ALT: 69 U/L — ABNORMAL HIGH (ref 0–44)
AST: 47 U/L — ABNORMAL HIGH (ref 15–41)
Albumin: 2.8 g/dL — ABNORMAL LOW (ref 3.5–5.0)
Alkaline Phosphatase: 110 U/L (ref 38–126)
Anion gap: 12 (ref 5–15)
BUN: 10 mg/dL (ref 6–20)
CO2: 20 mmol/L — ABNORMAL LOW (ref 22–32)
Calcium: 9 mg/dL (ref 8.9–10.3)
Chloride: 94 mmol/L — ABNORMAL LOW (ref 98–111)
Creatinine, Ser: 1.13 mg/dL (ref 0.61–1.24)
GFR, Estimated: >60 mL/min (ref >60)
Glucose, Bld: 149 mg/dL — ABNORMAL HIGH (ref 70–99)
Potassium: 5.1 mmol/L (ref 3.5–5.1)
Sodium: 126 mmol/L — ABNORMAL LOW (ref 135–145)
Total Bilirubin: 0.6 mg/dL (ref 0.3–1.2)
Total Protein: 8 g/dL (ref 6.5–8.1)

## 2020-12-29 LAB — ECHOCARDIOGRAM COMPLETE
Area-P 1/2: 4.49 cm2
S' Lateral: 3 cm

## 2020-12-29 LAB — CBC
HCT: 42.4 % (ref 39.0–52.0)
Hemoglobin: 13.5 g/dL (ref 13.0–17.0)
MCH: 21.7 pg — ABNORMAL LOW (ref 26.0–34.0)
MCHC: 31.8 g/dL (ref 30.0–36.0)
MCV: 68.2 fL — ABNORMAL LOW (ref 80.0–100.0)
Platelets: 631 10*3/uL — ABNORMAL HIGH (ref 150–400)
RBC: 6.22 MIL/uL — ABNORMAL HIGH (ref 4.22–5.81)
RDW: 17.2 % — ABNORMAL HIGH (ref 11.5–15.5)
WBC: 21.1 10*3/uL — ABNORMAL HIGH (ref 4.0–10.5)
nRBC: 0 % (ref 0.0–0.2)

## 2020-12-29 LAB — CREATININE, SERUM
Creatinine, Ser: 1.1 mg/dL (ref 0.61–1.24)
GFR, Estimated: >60 mL/min (ref >60)

## 2020-12-29 LAB — TROPONIN I (HIGH SENSITIVITY)
Troponin I (High Sensitivity): 10 ng/L (ref <18)
Troponin I (High Sensitivity): 14 ng/L (ref <18)

## 2020-12-29 LAB — MAGNESIUM: Magnesium: 2.1 mg/dL (ref 1.7–2.4)

## 2020-12-29 LAB — RESP PANEL BY RT-PCR (FLU A&B, COVID) ARPGX2
Influenza A by PCR: NEGATIVE
Influenza B by PCR: NEGATIVE
SARS Coronavirus 2 by RT PCR: NEGATIVE

## 2020-12-29 LAB — HIV ANTIBODY (ROUTINE TESTING W REFLEX): HIV Screen 4th Generation wRfx: NONREACTIVE

## 2020-12-29 LAB — SEDIMENTATION RATE: Sed Rate: 21 mm/hr — ABNORMAL HIGH (ref 0–16)

## 2020-12-29 LAB — TSH
TSH: 5.696 u[IU]/mL — ABNORMAL HIGH (ref 0.350–4.500)
TSH: 4.724 u[IU]/mL — ABNORMAL HIGH (ref 0.350–4.500)

## 2020-12-29 LAB — PROCALCITONIN: Procalcitonin: 0.81 ng/mL

## 2020-12-29 LAB — CK: Total CK: 64 U/L (ref 49–397)

## 2020-12-29 LAB — C-REACTIVE PROTEIN: CRP: 27.9 mg/dL — ABNORMAL HIGH (ref <1.0)

## 2020-12-29 MED ORDER — ONDANSETRON HCL 4 MG/2ML IJ SOLN: 4.0000 mg | Freq: Four times a day (QID) | INTRAMUSCULAR | Status: DC | PRN

## 2020-12-29 MED ORDER — IBUPROFEN 600 MG PO TABS
800.0000 mg | ORAL_TABLET | Freq: Three times a day (TID) | ORAL | Status: DC
  Administered 2020-12-29 – 2021-01-03 (×14): 800 mg via ORAL
  Filled 2020-12-29 (×14): qty 1

## 2020-12-29 MED ORDER — ZOLPIDEM TARTRATE 5 MG PO TABS: 5.0000 mg | ORAL_TABLET | Freq: Every evening | ORAL | Status: DC | PRN

## 2020-12-29 MED ORDER — COLCHICINE 0.6 MG PO TABS
0.6000 mg | ORAL_TABLET | Freq: Two times a day (BID) | ORAL | Status: AC
  Administered 2020-12-29 – 2020-12-30 (×2): 0.6 mg via ORAL
  Filled 2020-12-29 (×2): qty 1

## 2020-12-29 MED ORDER — MORPHINE SULFATE (PF) 2 MG/ML IV SOLN
2.0000 mg | INTRAVENOUS | Status: DC | PRN
  Administered 2020-12-29 – 2020-12-31 (×12): 2 mg via INTRAVENOUS
  Administered 2021-01-01: 4 mg via INTRAVENOUS
  Administered 2021-01-01 – 2021-01-02 (×2): 2 mg via INTRAVENOUS
  Filled 2020-12-29: qty 2
  Filled 2020-12-29 (×8): qty 1
  Filled 2020-12-29: qty 2
  Filled 2020-12-29 (×5): qty 1

## 2020-12-29 MED ORDER — KETOROLAC TROMETHAMINE 30 MG/ML IJ SOLN
30.0000 mg | Freq: Once | INTRAMUSCULAR | Status: AC
  Administered 2020-12-29: 30 mg via INTRAVENOUS
  Filled 2020-12-29: qty 1

## 2020-12-29 MED ORDER — IOHEXOL 350 MG/ML SOLN
80.0000 mL | Freq: Once | INTRAVENOUS | Status: AC | PRN
  Administered 2020-12-29: 80 mL via INTRAVENOUS

## 2020-12-29 MED ORDER — ENOXAPARIN SODIUM 40 MG/0.4ML IJ SOSY
40.0000 mg | PREFILLED_SYRINGE | Freq: Every day | INTRAMUSCULAR | Status: DC
Start: 2020-12-29 — End: 2021-01-03
  Administered 2020-12-29 – 2021-01-02 (×5): 40 mg via SUBCUTANEOUS
  Filled 2020-12-29 (×5): qty 0.4

## 2020-12-29 MED ORDER — FUROSEMIDE 10 MG/ML IJ SOLN
40.0000 mg | Freq: Once | INTRAMUSCULAR | Status: AC
  Administered 2020-12-29: 40 mg via INTRAVENOUS
  Filled 2020-12-29: qty 4

## 2020-12-29 MED ORDER — ACETAMINOPHEN 325 MG PO TABS
650.0000 mg | ORAL_TABLET | ORAL | Status: DC | PRN
Start: 2020-12-29 — End: 2021-01-03

## 2020-12-29 MED ORDER — SODIUM CHLORIDE 0.9 % IV SOLN
500.0000 mg | Freq: Once | INTRAVENOUS | Status: AC
  Administered 2020-12-29: 500 mg via INTRAVENOUS
  Filled 2020-12-29: qty 500

## 2020-12-29 MED ORDER — COLCHICINE 0.6 MG PO TABS
0.6000 mg | ORAL_TABLET | Freq: Every day | ORAL | Status: DC
  Administered 2020-12-30 – 2021-01-03 (×5): 0.6 mg via ORAL
  Filled 2020-12-29 (×5): qty 1

## 2020-12-29 MED ORDER — NITROGLYCERIN 0.4 MG SL SUBL: 0.4000 mg | SUBLINGUAL_TABLET | SUBLINGUAL | Status: DC | PRN

## 2020-12-29 MED ORDER — ALPRAZOLAM 0.25 MG PO TABS: 0.2500 mg | ORAL_TABLET | Freq: Two times a day (BID) | ORAL | Status: DC | PRN

## 2020-12-29 MED ORDER — SODIUM CHLORIDE 0.9 % IV SOLN
1.0000 g | Freq: Once | INTRAVENOUS | Status: AC
  Administered 2020-12-29: 1 g via INTRAVENOUS
  Filled 2020-12-29: qty 10

## 2020-12-29 NOTE — ED Notes (Signed)
The patient is in imaging. Vitals will be updated whenever he

## 2020-12-29 NOTE — ED Triage Notes (Signed)
Pt here POV with c/o of chest pain. Can lay down, Endorses SOB. Lung sounds diminished but clear.

## 2020-12-29 NOTE — ED Notes (Signed)
Echo and cards at bedside at this time

## 2020-12-29 NOTE — ED Provider Notes (Signed)
Emergency Medicine Provider Triage Evaluation Note  [name] , a 55 y.o. male  was evaluated in triage.  Pt complains of chest pain, shortness of breath.  Patient reports that his chest pain has been ongoing since being in the hospital for pneumonia and pericardial effusion.  Patient was discharged on 7/17.  Patient reports that his symptoms have gotten worse over the last few days.  Patient endorses palpitations.  Patient states that pain wraps around his entire chest.  Patient denies any fevers, chills, leg swelling, abdominal pain, nausea, vomiting, diaphoresis.  Review of Systems  Positive: Chest pain, shortness of breath, palpitations Negative: fevers, chills, leg swelling, abdominal pain, nausea, vomiting, diaphoresis.  Physical Exam  BP (!) 163/86 (BP Location: Right Arm)   Pulse (!) 112   Temp 99.4 F (37.4 C) (Oral)   Resp 19   SpO2 95%  Gen:   Awake, no distress   Resp:  Normal effort, decreased lung sounds MSK:   Moves extremities without difficulty  Other:  Abdomen soft, nondistended, nontender.  +2 radial pulse bilaterally.  Medical Decision Making  Medically screening exam initiated at 10:49 AM.  Appropriate orders placed.  AZARIA BARTELL was informed that the remainder of the evaluation will be completed by another provider, this initial triage assessment does not replace that evaluation, and the importance of remaining in the ED until their evaluation is complete.  The patient appears stable so that the remainder of the work up may be completed by another provider.      Haskel Schroeder, PA-C 12/29/20 1058    Tegeler, Canary Brim, MD 12/29/20 1245

## 2020-12-29 NOTE — H&P (Signed)
Cardiology History and Physical:   Patient ID: Alan Copeland MRN: [id number]; DOB: [birth year]  Admit date: 12/29/2020 Date of Consult: 12/29/2020  PCP:  [facility]   [facility] Providers Cardiologist:  Jenkins Rouge, MD   {  Patient Profile:   Alan Copeland is a 55 y.o. male with a hx of peric effusion, PNA, CAD w/ no sig ISR or other dz in 2018, Coronary spasm, HTN, HLD, DMII, who is being seen 12/29/2020 for the evaluation of increased effusion at the request of Dr Sherry Ruffing.  History of Present Illness:   [name] was hospitalized in [facility] for sepsis 2nd PNA and SOB 07/12-07/17. He was diagnosed w/ pericardial effusion, given steroids during his stay but not d/c'd on steroids or colchicine. ESR 102, CRP 356.3.  Since d/c, he has noticed ongoing DOE, +cough w/ occ thick white sputum, sweats but no fevers.  Chest pain started 2 days ago, in his back. It moved to his chest, was worse with lying down and taking a deep breath. Better w/ sitting up and leaning forward. 8/10 at its worst.   Finally, he could not take it any more and came to the ER.  In the ER,  he is still having significant pain.  However, he is not having the same pain he was having with his heart. This is different from his pre-PCI pain.  He has had greenish stools since d/c, but denies diarrhea.   Past Medical History:  Diagnosis Date   Anginal pain ([place])    CAD (coronary artery disease) 01/25/2012   a. RCA ptca/des 2013 with NSTEMI b. restenosis of RCA in 07/2013 w/ DES placed c. cath 06/17/2014 negative cath, patent stent in RCA with minimal restenosis  d. LHC 11/28/16- no change   Carotid bruit    Coronary artery spasm ([place]) 11/28/2016   See on cath 11/28/16   High cholesterol    Hypertension    Sciatica    Sinus headache    "weekly" (07/24/2013)   Type II diabetes mellitus ([place]) dx'd 2013    Past Surgical History:  Procedure Laterality Date    CARDIAC CATHETERIZATION  07/24/2013   CARDIAC CATHETERIZATION N/A 06/17/2015   Procedure: Left Heart Cath and Coronary Angiography;  Surgeon: Burnell Blanks, MD;  Location: [facility];  Service: Cardiovascular;  Laterality: N/A;   CORONARY ANGIOPLASTY WITH STENT PLACEMENT  01/2012   "1"   INGUINAL HERNIA REPAIR  ~ 1985   left   LEFT HEART CATH AND CORONARY ANGIOGRAPHY N/A 11/28/2016   Procedure: Left Heart Cath and Coronary Angiography;  Surgeon: Burnell Blanks, MD;  Location: [facility];  Service: Cardiovascular;  Laterality: N/A;   LEFT HEART CATHETERIZATION WITH CORONARY ANGIOGRAM N/A 01/26/2012   Procedure: LEFT HEART CATHETERIZATION WITH CORONARY ANGIOGRAM;  Surgeon: Sinclair Grooms, MD;  Location: [facility];  Service: Cardiovascular;  Laterality: N/A;   LEFT HEART CATHETERIZATION WITH CORONARY ANGIOGRAM N/A 07/24/2013   Procedure: LEFT HEART CATHETERIZATION WITH CORONARY ANGIOGRAM;  Surgeon: Lorretta Harp, MD;  Location: [facility] CATH LAB;  Service: Cardiovascular;  Laterality: N/A;   PERCUTANEOUS CORONARY STENT INTERVENTION (PCI-S) N/A 07/27/2013   Procedure: PERCUTANEOUS CORONARY STENT INTERVENTION (PCI-S);  Surgeon: Blane Ohara, MD;  Location: [facility] CATH LAB;  Service: Cardiovascular;  Laterality: N/A;     Home Medications:  Prior to Admission medications   Medication Sig Start Date End Date Taking? Authorizing Provider  aspirin  81 MG tablet Take 81 mg by mouth daily.    [provider]  atorvastatin (LIPITOR) 20 MG tablet Take 20 mg by mouth at bedtime.    [provider]  clopidogrel (PLAVIX) 75 MG tablet Take 75 mg by mouth daily with breakfast.    [provider]  isosorbide mononitrate (IMDUR) 60 MG 24 hr tablet Take 1 tablet (60 mg total) by mouth every morning. 11/28/16 11/28/17  Daune Perch, NP  lisinopril-hydrochlorothiazide (PRINZIDE,ZESTORETIC) 20-12.5 MG tablet Take 1 tablet by mouth daily. 06/17/15   Almyra Deforest, PA   metFORMIN (GLUCOPHAGE) 500 MG tablet Take 500 mg by mouth at bedtime.    [provider]  metoprolol tartrate (LOPRESSOR) 25 MG tablet Take 25 mg by mouth 2 (two) times daily.    [provider]  nitroGLYCERIN (NITROSTAT) 0.4 MG SL tablet Place 1 tablet (0.4 mg total) under the tongue every 5 (five) minutes as needed for chest pain. 01/31/18   Daune Perch, NP  pantoprazole (PROTONIX) 20 MG tablet Take 20 mg by mouth 2 (two) times daily.    [provider]    Inpatient Medications: Scheduled Meds:  Continuous Infusions:  azithromycin     cefTRIAXone (ROCEPHIN)  IV     PRN Meds:   Allergies:   No Known Allergies  Social History:   Social History   Socioeconomic History   Marital status: Married    Spouse name: Not on file   Number of children: Not on file   Years of education: Not on file   Highest education level: Not on file  Occupational History   Not on file  Tobacco Use   Smoking status: Former    Packs/day: 0.50    Years: 29.00    Pack years: 14.50    Types: Cigarettes    Quit date: 06/11/2013    Years since quitting: 7.5   Smokeless tobacco: Never  Vaping Use   Vaping Use: Never used  Substance and Sexual Activity   Alcohol use: Yes    Alcohol/week: 1.0 standard drink    Types: 1 Cans of beer per week    Comment: 1 can of beer every 1-2 weeks   Drug use: No    Types: Marijuana    Comment: Last used in 2013   Sexual activity: Yes  Other Topics Concern   Not on file  Social History Narrative   Not on file   Social Determinants of Health   Financial Resource Strain: Not on file  Food Insecurity: Not on file  Transportation Needs: Not on file  Physical Activity: Not on file  Stress: Not on file  Social Connections: Not on file  Intimate Partner Violence: Not on file    Family History:   Family History  Problem Relation Age of Onset   Hypertension Mother    Diabetes Mother    Hypertension Father    Diabetes Father     Hypertension Brother      ROS:  Please see the history of present illness.  All other ROS reviewed and negative.     Physical Exam/Data:   Vitals:   12/29/20 1157 12/29/20 1316 12/29/20 1625 12/29/20 1627  BP: (!) 146/89 (!) 148/87 (!) 167/97   Pulse: (!) 107 (!) 103 (!) 103   Resp: 18 18 (!) 38   Temp: 98.9 F (37.2 C) 99.5 F (37.5 C)  99.4 F (37.4 C)  TempSrc: Oral Oral  Oral  SpO2: 98% 100% 98%  No intake or output data in the 24 hours ending 12/29/20 1654 Last 3 Weights 02/05/2018 01/31/2018 12/06/2016  Weight (lbs) 211 lb 211 lb 206 lb 1.9 oz  Weight (kg) 95.709 kg 95.709 kg 93.495 kg     There is no height or weight on file to calculate BMI.  General:  Well nourished, well developed, in acute distress HEENT: normal for age Lymph: no adenopathy Neck: JVD 9 cm Endocrine:  No thryomegaly Vascular: No carotid bruits; 4/4 extremity pulses 2+ bilaterally Cardiac:  normal S1, S2; RRR; no murmur, no rub heard 2nd resp noise and pt movement Lungs: rales bases bilaterally, no wheezing, rhonchi    Abd: soft, nontender, no hepatomegaly  Ext: no edema Musculoskeletal:  No deformities, BUE and BLE strength normal and equal Skin: warm and dry  Neuro:  CNs 2-12 intact, no focal abnormalities noted Psych:  Normal affect   EKG:  The EKG was personally reviewed and demonstrates:  ST, HR 112, PR depression noted Telemetry:  Telemetry was personally reviewed and demonstrates:  ST  Relevant CV Studies:  ECHO: 12/29/2020 STAT, RESULTS PENDING BUT EFFUSION HAS INCREASED IN SIZE  ECHO: 12/20/2020 SUMMARY  Left ventricular systolic function is normal.  LV ejection fraction = 60-65%.  No segmental wall motion abnormalities seen in the left ventricle  The right ventricle is mildly dilated.  The right ventricular systolic function is normal.  There is small size pericardial effusion.  There is a small change in the mitral and tricuspid inflow velocities  with respiration, but  within normal limits.  No convincing evidence of elevated pericardial pressure.  There is no comparison study available.   Laboratory Data:  High Sensitivity Troponin:   Recent Labs  Lab 12/29/20 1045  TROPONINIHS 10     Chemistry Recent Labs  Lab 12/29/20 1045  NA 126*  K 5.1  CL 94*  CO2 20*  GLUCOSE 149*  BUN 10  CREATININE 1.13  CALCIUM 9.0  GFRNONAA >60  ANIONGAP 12    Recent Labs  Lab 12/29/20 1045  PROT 8.0  ALBUMIN 2.8*  AST 47*  ALT 69*  ALKPHOS 110  BILITOT 0.6   Hematology Recent Labs  Lab 12/29/20 1045  WBC 17.5*  RBC 6.56*  HGB 14.1  HCT 44.1  MCV 67.2*  MCH 21.5*  MCHC 32.0  RDW 17.6*  PLT 804*   BNPNo results for input(s): BNP, PROBNP in the last 168 hours.  DDimer No results for input(s): DDIMER in the last 168 hours. Lab Results  Component Value Date   TSH 4.387 11/27/2016   Lab Results  Component Value Date   HGBA1C 6.0 (H) 11/27/2016   Lab Results  Component Value Date   CHOL 156 01/31/2018   HDL 57 01/31/2018   LDLCALC 89 01/31/2018   TRIG 49 01/31/2018   CHOLHDL 2.7 01/31/2018     Radiology/Studies:  DG Chest 2 View  Result Date: 12/29/2020 CLINICAL DATA:  Chest pain, shortness of breath. EXAM: CHEST - 2 VIEW COMPARISON:  December 30, 2020. FINDINGS: Stable cardiomediastinal silhouette. Mild bibasilar subsegmental atelectasis is noted with associated small pleural effusions. No pneumothorax is noted. Bony thorax is unremarkable. IMPRESSION: Mild bibasilar subsegmental atelectasis is noted with small pleural effusions. Electronically Signed   By: Marijo Conception M.D.   On: 12/29/2020 11:37   CT Angio Chest PE W and/or Wo Contrast  Result Date: 12/29/2020 CLINICAL DATA:  Chest pain and shortness of breath. Recent pneumonia and diagnosis of pericardial effusion. EXAM:  CT ANGIOGRAPHY CHEST WITH CONTRAST TECHNIQUE: Multidetector CT imaging of the chest was performed using the standard protocol during bolus administration of  intravenous contrast. Multiplanar CT image reconstructions and MIPs were obtained to evaluate the vascular anatomy. CONTRAST:  82m OMNIPAQUE IOHEXOL 350 MG/ML SOLN COMPARISON:  CT of the chest without contrast on 12/24/2020 and [facility] FINDINGS: Cardiovascular: Large pericardial effusion present with density measurements again suggesting nonhemorrhagic fluid. Overall volume appears larger compared to the prior CT studies and there now is evidence of further narrowing of the cavity volume of both right and left ventricles suggestive of restrictive physiology/tamponade. No pericardial masses, enhancement or calcifications identified. Density related to a previously placed right coronary artery stent again noted. The thoracic aorta is normal in caliber. Central pulmonary arteries are normal in caliber. No evidence of pulmonary embolism. Mediastinum/Nodes: Enlarged right paratracheal lymph node relatively stable in size measuring approximately 15 mm in short axis. Enlarged left internal mammary lymph node appears stable measuring approximately 11 mm. Other small scattered mediastinal lymph nodes again identified. Lungs/Pleura: Persistent atelectasis/consolidation of the left lower lobe. Small left pleural effusion remains with slight decrease in volume since the prior studies. Right basilar atelectasis. No pneumothorax. No visible underlying pulmonary masses. Upper Abdomen: No acute abnormality. Musculoskeletal: No chest wall abnormality. No acute or significant osseous findings. Review of the MIP images confirms the above findings. IMPRESSION: 1. Enlargement of large pericardial effusion since prior CT studies with further evidence of narrowing of right and left ventricular cavity suggestive of restrictive physiology/tamponade. 2. Stable enlarged right paratracheal and left internal mammary lymph nodes and other scattered small mediastinal lymph nodes. 3.  Stable atelectasis/consolidation of the left lower lobe. Associated small left pleural effusion shows slight decrease in volume. Electronically Signed   By: GAletta EdouardM.D.   On: 12/29/2020 16:28     Assessment and Plan:   Pericardial effusion -  no tamponade per Dr ROval Linsey- needs high-dose NSAIDs, colchicine -  pain meds overnight - Lasix 40 mg IV x 1 - ck trop to make sure no myocarditis - follow sx and recheck echo prior to d/c   Risk Assessment/Risk Scores:       For questions or updates, please contact [facility] Please consult [email or web address] for contact info under    Signed, RRosaria Ferries PA-C  12/29/2020 4:54 PM

## 2020-12-29 NOTE — ED Notes (Signed)
Called pt for vitals no response  

## 2020-12-29 NOTE — ED Provider Notes (Signed)
[facility] EMERGENCY DEPARTMENT Provider Note   CSN: [id number] Arrival date & time: 12/29/20  1007     History Chief Complaint  Patient presents with   Chest Pain    Alan Copeland is a 55 y.o. male with a history of CAD, high cholesterol, hypertension, diabetes type II, NSTEMI.  Patient presents emerged department with a chief complaint of chest pain and shortness of breath.  Patient states that his symptoms have been present since he was discharged from [facility] on 7/17.  Patient reports that his symptoms have gotten progressively worse since then.  Symptoms have been constant over this time.  Patient endorses orthopnea.  Chest pain is described as a band around his chest and back.  Patient describes pain as a pressure.  Patient rates pain 7/10 on the pain scale.  No aggravating or alleviating factors.  Patient states that today he has had 2 episodes of diaphoresis.  Endorses palpitations when moving to laying supine position.  Patient also endorses productive cough.  States that cough is producing white mucus.  Denies any hemoptysis.  Patient has been taking all antibiotics as prescribed.  Patient denies any leg swelling, abdominal pain, nausea, vomiting, syncope, lightheadedness, dizziness, fever, chills.    Per chart review patient 7/12-7/17 for pneumonia and pericardial effusion.  TTE was performed showed small pericardial effusion with no tamponade.  Cardiology was consulted and recommended repeat echo in 1 month.  Repeat CT chest showed stable pleural effusion.  Patient was started on 30 days of oral Lasix.  Had CTA of chest which showed no PE.  Patient was given ceftriaxone and azithromycin for pneumonia and prescribed 7-day course of Levaquin upon discharge.      Chest Pain Associated symptoms: cough, diaphoresis, palpitations and shortness of breath   Associated symptoms: no abdominal pain, no back pain, no dizziness, no fever, no  headache, no nausea, no numbness, no vomiting and no weakness       Past Medical History:  Diagnosis Date   Anginal pain (HCC)    CAD (coronary artery disease) 01/25/2012   a. RCA ptca/des 2013 with NSTEMI b. restenosis of RCA in 07/2013 w/ DES placed c. cath 06/17/2014 negative cath, patent stent in RCA with minimal restenosis  d. LHC 11/28/16- no change   Carotid bruit    Coronary artery spasm (HCC) 11/28/2016   See on cath 11/28/16   High cholesterol    Hypertension    Sciatica    Sinus headache    "weekly" (07/24/2013)   Type II diabetes mellitus (HCC) dx'd 2013    Patient Active Problem List   Diagnosis Date Noted   Coronary artery spasm (HCC) 11/28/2016   ACS (acute coronary syndrome) (HCC) 11/28/2016   Ischemic chest pain (HCC) 11/27/2016   H/O heart artery stent 12/21/2015   Old MI (myocardial infarction) 12/21/2015   Precordial pain    Unstable angina (HCC) 06/16/2015   HLD (hyperlipidemia) 06/16/2015   Type 2 diabetes mellitus without complication (HCC) 12/17/2014   CAD (coronary artery disease) 07/24/2013   Chest pain 07/20/2013   Acute sinusitis 05/14/2013   Hypertension, malignant 05/14/2013   Coronary atherosclerosis 05/14/2013   Mixed hyperlipidemia 05/14/2013   Abnormal glucose 03/11/2013   Male erectile disorder 06/18/2012   MI, acute, non ST segment elevation (HCC) 01/25/2012   Hypertension 01/25/2012   Tobacco use 01/25/2012   Gastro-esophageal reflux disease without esophagitis 01/25/2012   Acute non-ST-elevation myocardial infarction (HCC) 01/25/2012  Past Surgical History:  Procedure Laterality Date   CARDIAC CATHETERIZATION  07/24/2013   CARDIAC CATHETERIZATION N/A 06/17/2015   Procedure: Left Heart Cath and Coronary Angiography;  Surgeon: Kathleene Hazel, MD;  Location: [facility] INVASIVE CV LAB;  Service: Cardiovascular;  Laterality: N/A;   CORONARY ANGIOPLASTY WITH STENT PLACEMENT  01/2012   "1"   INGUINAL HERNIA REPAIR  ~ 1985   left   LEFT  HEART CATH AND CORONARY ANGIOGRAPHY N/A 11/28/2016   Procedure: Left Heart Cath and Coronary Angiography;  Surgeon: Kathleene Hazel, MD;  Location: [facility] INVASIVE CV LAB;  Service: Cardiovascular;  Laterality: N/A;   LEFT HEART CATHETERIZATION WITH CORONARY ANGIOGRAM N/A 01/26/2012   Procedure: LEFT HEART CATHETERIZATION WITH CORONARY ANGIOGRAM;  Surgeon: Lesleigh Noe, MD;  Location: [facility] CATH LAB;  Service: Cardiovascular;  Laterality: N/A;   LEFT HEART CATHETERIZATION WITH CORONARY ANGIOGRAM N/A 07/24/2013   Procedure: LEFT HEART CATHETERIZATION WITH CORONARY ANGIOGRAM;  Surgeon: Runell Gess, MD;  Location: [facility] CATH LAB;  Service: Cardiovascular;  Laterality: N/A;   PERCUTANEOUS CORONARY STENT INTERVENTION (PCI-S) N/A 07/27/2013   Procedure: PERCUTANEOUS CORONARY STENT INTERVENTION (PCI-S);  Surgeon: Micheline Chapman, MD;  Location: [facility] CATH LAB;  Service: Cardiovascular;  Laterality: N/A;       Family History  Problem Relation Age of Onset   Hypertension Mother    Diabetes Mother    Hypertension Father    Diabetes Father    Hypertension Brother     Social History   Tobacco Use   Smoking status: Former    Packs/day: 0.50    Years: 29.00    Pack years: 14.50    Types: Cigarettes    Quit date: 06/11/2013    Years since quitting: 7.5   Smokeless tobacco: Never  Vaping Use   Vaping Use: Never used  Substance Use Topics   Alcohol use: Yes    Alcohol/week: 1.0 standard drink    Types: 1 Cans of beer per week    Comment: 1 can of beer every 1-2 weeks   Drug use: No    Types: Marijuana    Comment: Last used in 2013    Home Medications Prior to Admission medications   Medication Sig Start Date End Date Taking? Authorizing Provider  aspirin 81 MG tablet Take 81 mg by mouth daily.    [provider]  atorvastatin (LIPITOR) 20 MG tablet Take 20 mg by mouth at bedtime.    [provider]  clopidogrel (PLAVIX) 75 MG tablet Take 75 mg by mouth daily with  breakfast.    [provider]  isosorbide mononitrate (IMDUR) 60 MG 24 hr tablet Take 1 tablet (60 mg total) by mouth every morning. 11/28/16 11/28/17  [name], NP  lisinopril-hydrochlorothiazide (PRINZIDE,ZESTORETIC) 20-12.5 MG tablet Take 1 tablet by mouth daily. 06/17/15   Azalee Course, PA  metFORMIN (GLUCOPHAGE) 500 MG tablet Take 500 mg by mouth at bedtime.    [provider]  metoprolol tartrate (LOPRESSOR) 25 MG tablet Take 25 mg by mouth 2 (two) times daily.    [provider]  nitroGLYCERIN (NITROSTAT) 0.4 MG SL tablet Place 1 tablet (0.4 mg total) under the tongue every 5 (five) minutes as needed for chest pain. 01/31/18   [name], NP  pantoprazole (PROTONIX) 20 MG tablet Take 20 mg by mouth 2 (two) times daily.    [provider]    Allergies    Patient has no known allergies.  Review of Systems  Review of Systems  Constitutional:  Positive for diaphoresis. Negative for chills and fever.  Eyes:  Negative for visual disturbance.  Respiratory:  Positive for cough and shortness of breath.   Cardiovascular:  Positive for chest pain and palpitations. Negative for leg swelling.  Gastrointestinal:  Negative for abdominal distention, abdominal pain, nausea and vomiting.  Genitourinary:  Negative for difficulty urinating and dysuria.  Musculoskeletal:  Negative for back pain and neck pain.  Skin:  Negative for color change and rash.  Neurological:  Negative for dizziness, syncope, weakness, light-headedness, numbness and headaches.  Psychiatric/Behavioral:  Negative for confusion.    Physical Exam Updated Vital Signs BP (!) 167/97   Pulse (!) 103   Temp 99.4 F (37.4 C) (Oral)   Resp (!) 38   SpO2 98%   Physical Exam Vitals and nursing note reviewed.  Constitutional:      General: He is not in acute distress.    Appearance: He is ill-appearing. He is not toxic-appearing or diaphoretic.  HENT:     Head: Normocephalic.  Eyes:      General: No scleral icterus.       Right eye: No discharge.        Left eye: No discharge.  Cardiovascular:     Rate and Rhythm: Tachycardia present.     Pulses:          Radial pulses are 2+ on the right side and 2+ on the left side.     Heart sounds: Heart sounds are distant.  Pulmonary:     Effort: Pulmonary effort is normal. Tachypnea present. No bradypnea or respiratory distress.     Breath sounds: Normal breath sounds. No stridor.     Comments: Speaks in shortened sentences Abdominal:     General: There is no distension. There are no signs of injury.     Palpations: Abdomen is soft. There is no mass or pulsatile mass.     Tenderness: There is no abdominal tenderness. There is no guarding or rebound.     Hernia: There is no hernia in the umbilical area or ventral area.  Musculoskeletal:     Cervical back: Neck supple.     Right lower leg: No tenderness. No edema.     Left lower leg: No swelling or tenderness. No edema.  Skin:    General: Skin is warm and dry.     Coloration: Skin is not cyanotic or pale.  Neurological:     General: No focal deficit present.     Mental Status: He is alert.  Psychiatric:        Behavior: Behavior is cooperative.    ED Results / Procedures / Treatments   Labs (all labs ordered are listed, but only abnormal results are displayed) Labs Reviewed  CBC WITH DIFFERENTIAL/PLATELET - Abnormal; Notable for the following components:      Result Value   WBC 17.5 (*)    RBC 6.56 (*)    MCV 67.2 (*)    MCH 21.5 (*)    RDW 17.6 (*)    Platelets 804 (*)    Neutro Abs 14.2 (*)    Monocytes Absolute 1.6 (*)    Abs Immature Granulocytes 0.21 (*)    All other components within normal limits  COMPREHENSIVE METABOLIC PANEL - Abnormal; Notable for the following components:   Sodium 126 (*)    Chloride 94 (*)    CO2 20 (*)    Glucose, Bld 149 (*)    Albumin 2.8 (*)  AST 47 (*)    ALT 69 (*)    All other components within normal limits  RESP PANEL  BY RT-PCR (FLU A&B, COVID) ARPGX2  RESPIRATORY PANEL BY PCR  C-REACTIVE PROTEIN  CK  TSH  PROCALCITONIN  PROCALCITONIN  SEDIMENTATION RATE  TROPONIN I (HIGH SENSITIVITY)  TROPONIN I (HIGH SENSITIVITY)    EKG None  Radiology DG Chest 2 View  Result Date: 12/29/2020 CLINICAL DATA:  Chest pain, shortness of breath. EXAM: CHEST - 2 VIEW COMPARISON:  December 30, 2020. FINDINGS: Stable cardiomediastinal silhouette. Mild bibasilar subsegmental atelectasis is noted with associated small pleural effusions. No pneumothorax is noted. Bony thorax is unremarkable. IMPRESSION: Mild bibasilar subsegmental atelectasis is noted with small pleural effusions. Electronically Signed   By: Lupita Raider M.D.   On: 12/29/2020 11:37   CT Angio Chest PE W and/or Wo Contrast  Result Date: 12/29/2020 CLINICAL DATA:  Chest pain and shortness of breath. Recent pneumonia and diagnosis of pericardial effusion. EXAM: CT ANGIOGRAPHY CHEST WITH CONTRAST TECHNIQUE: Multidetector CT imaging of the chest was performed using the standard protocol during bolus administration of intravenous contrast. Multiplanar CT image reconstructions and MIPs were obtained to evaluate the vascular anatomy. CONTRAST:  36mL OMNIPAQUE IOHEXOL 350 MG/ML SOLN COMPARISON:  CT of the chest without contrast on 12/24/2020 and prior CTA of the chest on 12/20/2020 both at [facility]. FINDINGS: Cardiovascular: Large pericardial effusion present with density measurements again suggesting nonhemorrhagic fluid. Overall volume appears larger compared to the prior CT studies and there now is evidence of further narrowing of the cavity volume of both right and left ventricles suggestive of restrictive physiology/tamponade. No pericardial masses, enhancement or calcifications identified. Density related to a previously placed right coronary artery stent again noted. The thoracic aorta is normal in caliber. Central pulmonary arteries are normal in caliber. No  evidence of pulmonary embolism. Mediastinum/Nodes: Enlarged right paratracheal lymph node relatively stable in size measuring approximately 15 mm in short axis. Enlarged left internal mammary lymph node appears stable measuring approximately 11 mm. Other small scattered mediastinal lymph nodes again identified. Lungs/Pleura: Persistent atelectasis/consolidation of the left lower lobe. Small left pleural effusion remains with slight decrease in volume since the prior studies. Right basilar atelectasis. No pneumothorax. No visible underlying pulmonary masses. Upper Abdomen: No acute abnormality. Musculoskeletal: No chest wall abnormality. No acute or significant osseous findings. Review of the MIP images confirms the above findings. IMPRESSION: 1. Enlargement of large pericardial effusion since prior CT studies with further evidence of narrowing of right and left ventricular cavity suggestive of restrictive physiology/tamponade. 2. Stable enlarged right paratracheal and left internal mammary lymph nodes and other scattered small mediastinal lymph nodes. 3. Stable atelectasis/consolidation of the left lower lobe. Associated small left pleural effusion shows slight decrease in volume. Electronically Signed   By: Irish Lack M.D.   On: 12/29/2020 16:28    Procedures .Critical Care  Date/Time: 12/29/2020 6:48 PM Performed by: Haskel Schroeder, PA-C Authorized by: Haskel Schroeder, PA-C   Critical care provider statement:    Critical care time (minutes):  45   Critical care was necessary to treat or prevent imminent or life-threatening deterioration of the following conditions:  Sepsis   Critical care was time spent personally by me on the following activities:  Discussions with consultants, evaluation of patient's response to treatment, examination of patient, ordering and performing treatments and interventions, ordering and review of laboratory studies, ordering and review of radiographic studies,  pulse oximetry, re-evaluation of  patient's condition, obtaining history from patient or surrogate and review of old charts   Care discussed with: admitting provider     Medications Ordered in ED Medications  ibuprofen (ADVIL) tablet 800 mg (has no administration in time range)  colchicine tablet 0.6 mg (has no administration in time range)  colchicine tablet 0.6 mg (has no administration in time range)  morphine 2 MG/ML injection 2-4 mg (2 mg Intravenous Given 12/29/20 1739)  iohexol (OMNIPAQUE) 350 MG/ML injection 80 mL (80 mLs Intravenous Contrast Given 12/29/20 1610)  cefTRIAXone (ROCEPHIN) 1 g in sodium chloride 0.9 % 100 mL IVPB (0 g Intravenous Stopped 12/29/20 1740)  azithromycin (ZITHROMAX) 500 mg in sodium chloride 0.9 % 250 mL IVPB (500 mg Intravenous New Bag/Given 12/29/20 1746)  ketorolac (TORADOL) 30 MG/ML injection 30 mg (30 mg Intravenous Given 12/29/20 1715)  furosemide (LASIX) injection 40 mg (40 mg Intravenous Given 12/29/20 1742)    ED Course  I have reviewed the triage vital signs and the nursing notes.  Pertinent labs & imaging results that were available during my care of the patient were reviewed by me and considered in my medical decision making (see chart for details).  Clinical Course as of 12/29/20 1846  Thu Dec 29, 2020  1636 Spoke to cards master [PB]    Clinical Course User Index [PB] Berneice HeinrichBadalamente, Miken Stecher R, PA-C   MDM Rules/Calculators/A&P                           Alert 55 year old male ill-appearing.  Patient noted to be tachycardic at rate of 103, and tachypnea at rate of 38.  Patient presents with a chief complaint of shortness of breath and chest pain.  Symptoms have been present since being discharged from the hospital on 7/17.  Patient was treated there for sepsis secondary to pneumonia and discovered to have a pericardial effusion.    Lab work and CTA were obtained while patient was in triage.  CBC shows leukocytosis at 17.5; due to patient's known  pneumonia and continued leukocytosis despite outpatient treatment we will start patient on azithromycin and Rocephin.  Chest x-ray shows Mild bibasilar subsegmental atelectasis is noted with small pleural effusions. Troponin 10  CTA showed: - Enlargement of large pericardial effusion since prior CT studies with further evidence of narrowing of right and left ventricular cavity suggestive of restrictive physiology/tamponade. - Stable atelectasis/consolidation of the left lower lobe. Associated small left pleural effusion shows slight decrease in volume.  To do concern for restrictive physiology/tamponade we will consult cardiology team. 1636 spoke to cardsmaster who will will coordinate cardiology team to consult.    Echocardiogram placed.  Per cardiology team patient does not have cardiac tamponade, patient will need to be started on high-dose colchicine.  CK ordered to evaluate for myocarditis.  Patient will require recheck of echocardiogram prior to discharge.  Will consult hospitalist team for admission due to patient's pericardial effusion and pneumonia refractory to outpatient treatment.  Spoke to hospitalist who agreed to see the patient for admission.  Final Clinical Impression(s) / ED Diagnoses Final diagnoses:  Difficult intravenous access    Rx / DC Orders ED Discharge Orders     None        Berneice HeinrichBadalamente, Koraline Phillipson R, PA-C 12/29/20 1850    Bethann BerkshireZammit, Joseph, MD 01/02/21 1049

## 2020-12-29 NOTE — Progress Notes (Signed)
  Echocardiogram 2D Echocardiogram has been performed.  Alan Copeland Alan Copeland 12/29/2020, 5:24 PM

## 2020-12-30 ENCOUNTER — Encounter (HOSPITAL_COMMUNITY): Payer: Self-pay | Admitting: Cardiovascular Disease

## 2020-12-30 ENCOUNTER — Other Ambulatory Visit: Payer: Self-pay

## 2020-12-30 DIAGNOSIS — E871 Hypo-osmolality and hyponatremia: Secondary | ICD-10-CM | POA: Diagnosis present

## 2020-12-30 DIAGNOSIS — Z8249 Family history of ischemic heart disease and other diseases of the circulatory system: Secondary | ICD-10-CM | POA: Diagnosis not present

## 2020-12-30 DIAGNOSIS — I313 Pericardial effusion (noninflammatory): Secondary | ICD-10-CM | POA: Diagnosis present

## 2020-12-30 DIAGNOSIS — I2510 Atherosclerotic heart disease of native coronary artery without angina pectoris: Secondary | ICD-10-CM | POA: Diagnosis present

## 2020-12-30 DIAGNOSIS — E7800 Pure hypercholesterolemia, unspecified: Secondary | ICD-10-CM | POA: Diagnosis present

## 2020-12-30 DIAGNOSIS — Z7902 Long term (current) use of antithrombotics/antiplatelets: Secondary | ICD-10-CM | POA: Diagnosis not present

## 2020-12-30 DIAGNOSIS — I25111 Atherosclerotic heart disease of native coronary artery with angina pectoris with documented spasm: Secondary | ICD-10-CM | POA: Diagnosis not present

## 2020-12-30 DIAGNOSIS — Z20822 Contact with and (suspected) exposure to covid-19: Secondary | ICD-10-CM | POA: Diagnosis present

## 2020-12-30 DIAGNOSIS — Z7984 Long term (current) use of oral hypoglycemic drugs: Secondary | ICD-10-CM | POA: Diagnosis not present

## 2020-12-30 DIAGNOSIS — J189 Pneumonia, unspecified organism: Secondary | ICD-10-CM | POA: Diagnosis present

## 2020-12-30 DIAGNOSIS — E119 Type 2 diabetes mellitus without complications: Secondary | ICD-10-CM | POA: Diagnosis present

## 2020-12-30 DIAGNOSIS — Z79899 Other long term (current) drug therapy: Secondary | ICD-10-CM | POA: Diagnosis not present

## 2020-12-30 DIAGNOSIS — I10 Essential (primary) hypertension: Secondary | ICD-10-CM | POA: Diagnosis present

## 2020-12-30 DIAGNOSIS — Z7982 Long term (current) use of aspirin: Secondary | ICD-10-CM | POA: Diagnosis not present

## 2020-12-30 DIAGNOSIS — Z833 Family history of diabetes mellitus: Secondary | ICD-10-CM | POA: Diagnosis not present

## 2020-12-30 DIAGNOSIS — J918 Pleural effusion in other conditions classified elsewhere: Secondary | ICD-10-CM | POA: Diagnosis not present

## 2020-12-30 DIAGNOSIS — I300 Acute nonspecific idiopathic pericarditis: Secondary | ICD-10-CM | POA: Diagnosis present

## 2020-12-30 DIAGNOSIS — I309 Acute pericarditis, unspecified: Secondary | ICD-10-CM | POA: Diagnosis not present

## 2020-12-30 DIAGNOSIS — Z87891 Personal history of nicotine dependence: Secondary | ICD-10-CM | POA: Diagnosis not present

## 2020-12-30 DIAGNOSIS — K219 Gastro-esophageal reflux disease without esophagitis: Secondary | ICD-10-CM | POA: Diagnosis present

## 2020-12-30 DIAGNOSIS — I252 Old myocardial infarction: Secondary | ICD-10-CM | POA: Diagnosis not present

## 2020-12-30 DIAGNOSIS — E782 Mixed hyperlipidemia: Secondary | ICD-10-CM | POA: Diagnosis present

## 2020-12-30 LAB — EXPECTORATED SPUTUM ASSESSMENT W GRAM STAIN, RFLX TO RESP C: Special Requests: NORMAL

## 2020-12-30 LAB — COMPREHENSIVE METABOLIC PANEL
ALT: 50 U/L — ABNORMAL HIGH (ref 0–44)
AST: 28 U/L (ref 15–41)
Albumin: 2.5 g/dL — ABNORMAL LOW (ref 3.5–5.0)
Alkaline Phosphatase: 92 U/L (ref 38–126)
Anion gap: 13 (ref 5–15)
BUN: 11 mg/dL (ref 6–20)
CO2: 22 mmol/L (ref 22–32)
Calcium: 9 mg/dL (ref 8.9–10.3)
Chloride: 94 mmol/L — ABNORMAL LOW (ref 98–111)
Creatinine, Ser: 1.1 mg/dL (ref 0.61–1.24)
GFR, Estimated: >60 mL/min (ref >60)
Glucose, Bld: 96 mg/dL (ref 70–99)
Potassium: 4.3 mmol/L (ref 3.5–5.1)
Sodium: 129 mmol/L — ABNORMAL LOW (ref 135–145)
Total Bilirubin: 0.7 mg/dL (ref 0.3–1.2)
Total Protein: 7.1 g/dL (ref 6.5–8.1)

## 2020-12-30 LAB — PROCALCITONIN: Procalcitonin: 0.67 ng/mL

## 2020-12-30 LAB — T4, FREE: Free T4: 1.61 ng/dL — ABNORMAL HIGH (ref 0.61–1.12)

## 2020-12-30 LAB — GLUCOSE, CAPILLARY: Glucose-Capillary: 131 mg/dL — ABNORMAL HIGH (ref 70–99)

## 2020-12-30 MED ORDER — SODIUM CHLORIDE 0.9 % IV SOLN
1.0000 g | INTRAVENOUS | Status: AC
  Administered 2020-12-30 – 2021-01-03 (×5): 1 g via INTRAVENOUS
  Filled 2020-12-30 (×5): qty 10

## 2020-12-30 MED ORDER — CLOPIDOGREL BISULFATE 75 MG PO TABS
75.0000 mg | ORAL_TABLET | Freq: Every day | ORAL | Status: DC
  Administered 2020-12-31 – 2021-01-03 (×4): 75 mg via ORAL
  Filled 2020-12-30 (×4): qty 1

## 2020-12-30 MED ORDER — GUAIFENESIN 100 MG/5ML PO SOLN
5.0000 mL | ORAL | Status: DC | PRN
  Administered 2020-12-30 – 2021-01-03 (×5): 100 mg via ORAL
  Filled 2020-12-30 (×5): qty 5

## 2020-12-30 MED ORDER — ENSURE ENLIVE PO LIQD: 237.0000 mL | Freq: Two times a day (BID) | ORAL | Status: DC

## 2020-12-30 MED ORDER — METOPROLOL TARTRATE 25 MG PO TABS
25.0000 mg | ORAL_TABLET | Freq: Two times a day (BID) | ORAL | Status: DC
  Administered 2020-12-30 – 2021-01-03 (×9): 25 mg via ORAL
  Filled 2020-12-30 (×5): qty 1
  Filled 2020-12-30: qty 2
  Filled 2020-12-30 (×3): qty 1

## 2020-12-30 MED ORDER — SODIUM CHLORIDE 0.9 % IV SOLN: 1.0000 g | INTRAVENOUS | Status: DC

## 2020-12-30 MED ORDER — SODIUM CHLORIDE 0.9 % IV SOLN
500.0000 mg | INTRAVENOUS | Status: DC
  Administered 2020-12-30: 500 mg via INTRAVENOUS
  Filled 2020-12-30: qty 500

## 2020-12-30 MED ORDER — ASPIRIN 81 MG PO CHEW
81.0000 mg | CHEWABLE_TABLET | Freq: Every day | ORAL | Status: DC
  Administered 2020-12-30 – 2021-01-03 (×5): 81 mg via ORAL
  Filled 2020-12-30 (×5): qty 1

## 2020-12-30 MED ORDER — SODIUM CHLORIDE 0.9 % IV SOLN: 500.0000 mg | Freq: Once | INTRAVENOUS | Status: DC

## 2020-12-30 MED ORDER — ATORVASTATIN CALCIUM 10 MG PO TABS
20.0000 mg | ORAL_TABLET | Freq: Every day | ORAL | Status: DC
  Administered 2020-12-30 – 2021-01-02 (×4): 20 mg via ORAL
  Filled 2020-12-30 (×4): qty 2

## 2020-12-30 MED ORDER — ISOSORBIDE MONONITRATE ER 60 MG PO TB24
60.0000 mg | ORAL_TABLET | ORAL | Status: DC
  Administered 2020-12-30 – 2021-01-03 (×5): 60 mg via ORAL
  Filled 2020-12-30 (×5): qty 1

## 2020-12-30 MED ORDER — PANTOPRAZOLE SODIUM 20 MG PO TBEC
20.0000 mg | DELAYED_RELEASE_TABLET | Freq: Two times a day (BID) | ORAL | Status: DC
  Administered 2020-12-30 – 2021-01-03 (×9): 20 mg via ORAL
  Filled 2020-12-30 (×10): qty 1

## 2020-12-30 MED ORDER — SODIUM CHLORIDE 0.9 % IV SOLN
INTRAVENOUS | Status: DC | PRN
  Administered 2020-12-30: 250 mL via INTRAVENOUS

## 2020-12-30 NOTE — ED Notes (Signed)
Upon return to room for transport it was noted that pt had experienced coughing which he stated caused him increase in back pain.

## 2020-12-30 NOTE — Progress Notes (Signed)
Progress Note  Patient Name: Alan Copeland Date of Encounter: 12/30/2020  [facility] Cardiologist: [name], MD   Subjective   Feeling a little better.  Continues to have pain in his back when he coughs.  His chest discomfort is improving and he is able to lay back a little bit easier.  Inpatient Medications    Scheduled Meds:  colchicine  0.6 mg Oral BID   colchicine  0.6 mg Oral Daily   enoxaparin (LOVENOX) injection  40 mg Subcutaneous QHS   ibuprofen  800 mg Oral TID   Continuous Infusions:  azithromycin     cefTRIAXone (ROCEPHIN)  IV 1 g (12/30/20 0912)   PRN Meds: acetaminophen, ALPRAZolam, morphine injection, nitroGLYCERIN, ondansetron (ZOFRAN) IV, zolpidem   Vital Signs    Vitals:   12/30/20 0415 12/30/20 0651 12/30/20 0819 12/30/20 0854  BP: 127/78 (!) 152/87  (!) 145/76  Pulse: 88 95  (!) 101  Resp: (!) 29 (!) 31  (!) 24  Temp:   98.8 F (37.1 C)   TempSrc:   Oral   SpO2: 93% 92%  94%    Intake/Output Summary (Last 24 hours) at 12/30/2020 0922 Last data filed at 12/30/2020 0738 Gross per 24 hour  Intake --  Output 1150 ml  Net -1150 ml   Last 3 Weights 02/05/2018 01/31/2018 12/06/2016  Weight (lbs) 211 lb 211 lb 206 lb 1.9 oz  Weight (kg) 95.709 kg 95.709 kg 93.495 kg      Telemetry    Sins rhythm, sinus tachycardia.  - Personally Reviewed  ECG    Sinus rhythm.  Rate 92 bpm.  Evidence of pericarditis - Personally Reviewed  Physical Exam   VS:  BP (!) 145/76   Pulse (!) 101   Temp 98.8 F (37.1 C) (Oral)   Resp (!) 24   SpO2 94%  , BMI There is no height or weight on file to calculate BMI. GENERAL:  Ill-appearing HEENT: Pupils equal round and reactive, fundi not visualized, oral mucosa unremarkable NECK:  No jugular venous distention, waveform within normal limits, carotid upstroke brisk and symmetric, no bruits LUNGS:  Clear to auscultation bilaterally.  Tachypnea HEART:  RRR.  PMI not displaced or sustained,S1 and S2 within  normal limits, no S3, no S4, no clicks, no rubs, no murmurs ABD:  Flat, positive bowel sounds normal in frequency in pitch, no bruits, no rebound, no guarding, no midline pulsatile mass, no hepatomegaly, no splenomegaly EXT:  2 plus pulses throughout, no edema, no cyanosis no clubbing SKIN:  No rashes no nodules NEURO:  Cranial nerves II through XII grossly intact, motor grossly intact throughout PSYCH:  Cognitively intact, oriented to person place and time   Labs    High Sensitivity Troponin:   Recent Labs  Lab 12/29/20 1045 12/29/20 1632  TROPONINIHS 10 14      Chemistry Recent Labs  Lab 12/29/20 1045 12/29/20 2143 12/30/20 0515  NA 126*  --  129*  K 5.1  --  4.3  CL 94*  --  94*  CO2 20*  --  22  GLUCOSE 149*  --  96  BUN 10  --  11  CREATININE 1.13 1.10 1.10  CALCIUM 9.0  --  9.0  PROT 8.0  --  7.1  ALBUMIN 2.8*  --  2.5*  AST 47*  --  28  ALT 69*  --  50*  ALKPHOS 110  --  92  BILITOT 0.6  --  0.7  GFRNONAA >60 >60 >60  ANIONGAP 12  --  13     Hematology Recent Labs  Lab 12/29/20 1045 12/29/20 2143  WBC 17.5* 21.1*  RBC 6.56* 6.22*  HGB 14.1 13.5  HCT 44.1 42.4  MCV 67.2* 68.2*  MCH 21.5* 21.7*  MCHC 32.0 31.8  RDW 17.6* 17.2*  PLT 804* 631*    BNPNo results for input(s): BNP, PROBNP in the last 168 hours.   DDimer No results for input(s): DDIMER in the last 168 hours.   Radiology    DG Chest 2 View  Result Date: 12/29/2020 CLINICAL DATA:  Chest pain, shortness of breath. EXAM: CHEST - 2 VIEW COMPARISON:  December 30, 2020. FINDINGS: Stable cardiomediastinal silhouette. Mild bibasilar subsegmental atelectasis is noted with associated small pleural effusions. No pneumothorax is noted. Bony thorax is unremarkable. IMPRESSION: Mild bibasilar subsegmental atelectasis is noted with small pleural effusions. Electronically Signed   By: Lupita RaiderJames  Green Jr M.D.   On: 12/29/2020 11:37   CT Angio Chest PE W and/or Wo Contrast  Result Date: 12/29/2020 CLINICAL  DATA:  Chest pain and shortness of breath. Recent pneumonia and diagnosis of pericardial effusion. EXAM: CT ANGIOGRAPHY CHEST WITH CONTRAST TECHNIQUE: Multidetector CT imaging of the chest was performed using the standard protocol during bolus administration of intravenous contrast. Multiplanar CT image reconstructions and MIPs were obtained to evaluate the vascular anatomy. CONTRAST:  80mL OMNIPAQUE IOHEXOL 350 MG/ML SOLN COMPARISON:  CT of the chest without contrast on 12/24/2020 and prior CTA of the chest on 12/20/2020 both at [facility]. FINDINGS: Cardiovascular: Large pericardial effusion present with density measurements again suggesting nonhemorrhagic fluid. Overall volume appears larger compared to the prior CT studies and there now is evidence of further narrowing of the cavity volume of both right and left ventricles suggestive of restrictive physiology/tamponade. No pericardial masses, enhancement or calcifications identified. Density related to a previously placed right coronary artery stent again noted. The thoracic aorta is normal in caliber. Central pulmonary arteries are normal in caliber. No evidence of pulmonary embolism. Mediastinum/Nodes: Enlarged right paratracheal lymph node relatively stable in size measuring approximately 15 mm in short axis. Enlarged left internal mammary lymph node appears stable measuring approximately 11 mm. Other small scattered mediastinal lymph nodes again identified. Lungs/Pleura: Persistent atelectasis/consolidation of the left lower lobe. Small left pleural effusion remains with slight decrease in volume since the prior studies. Right basilar atelectasis. No pneumothorax. No visible underlying pulmonary masses. Upper Abdomen: No acute abnormality. Musculoskeletal: No chest wall abnormality. No acute or significant osseous findings. Review of the MIP images confirms the above findings. IMPRESSION: 1. Enlargement of large pericardial effusion since prior CT  studies with further evidence of narrowing of right and left ventricular cavity suggestive of restrictive physiology/tamponade. 2. Stable enlarged right paratracheal and left internal mammary lymph nodes and other scattered small mediastinal lymph nodes. 3. Stable atelectasis/consolidation of the left lower lobe. Associated small left pleural effusion shows slight decrease in volume. Electronically Signed   By: Irish LackGlenn  Yamagata M.D.   On: 12/29/2020 16:28   ECHOCARDIOGRAM COMPLETE  Result Date: 12/29/2020    ECHOCARDIOGRAM REPORT   Patient Name:   Alan Copeland Date of Exam: 12/29/2020 Medical Rec #:  [id number]          Height:       74.0 in Accession #:    [id number]         Weight:       211.0 lb Date of Birth:  [birth year]  BSA:          2.223 m Patient Age:    55 years           BP:           171/89 mmHg Patient Gender: M                  HR:           102 bpm. Exam Location:  Inpatient Procedure: 2D Echo, Cardiac Doppler and Color Doppler                       STAT ECHO Reported to: Dr. Izora Ribas on 12/29/2020 5:24:00 PM          Dr. Duke Salvia at bedside no Definity. Indications:    Tamponade I31.4  History:        Patient has no prior history of Echocardiogram examinations.                 CAD, Signs/Symptoms:Chest Pain; Risk Factors:Hypertension,                 Diabetes and Dyslipidemia.  Sonographer:    Elmarie Shiley Dance Referring Phys: [id number] PETER R BADALAMENTE IMPRESSIONS  1. Left ventricular ejection fraction, by estimation, is 60 to 65%. The left ventricle has normal function. The left ventricle has no regional wall motion abnormalities. There is mild concentric left ventricular hypertrophy. Left ventricular diastolic parameters are indeterminate.  2. Right ventricular systolic function is normal. The right ventricular size is normal. Tricuspid regurgitation signal is inadequate for assessing PA pressure.  3. The mitral valve is normal in structure. No evidence of mitral valve  regurgitation.  4. The aortic valve is tricuspid. Aortic valve regurgitation is not visualized. No aortic stenosis is present.  5. The inferior vena cava is normal in size with greater than 50% respiratory variability, suggesting right atrial pressure of 3 mmHg.  6. Moderate pericardial effusion. There is not evidence of increased pericardial pressure. There is tissue Doppler annulus Reversus and an intraventricular septal bounce, these are some, but not all of the factors seen in constriction. Comparison(s): No prior Echocardiogram. FINDINGS  Left Ventricle: Left ventricular ejection fraction, by estimation, is 60 to 65%. The left ventricle has normal function. The left ventricle has no regional wall motion abnormalities. 3D left ventricular ejection fraction analysis performed but not reported based on interpreter judgement due to suboptimal quality. The left ventricular internal cavity size was normal in size. There is mild concentric left ventricular hypertrophy. Left ventricular diastolic parameters are indeterminate. Right Ventricle: The right ventricular size is normal. No increase in right ventricular wall thickness. Right ventricular systolic function is normal. Tricuspid regurgitation signal is inadequate for assessing PA pressure. Left Atrium: Left atrial size was normal in size. Right Atrium: Right atrial size was normal in size. Pericardium: A moderately sized pericardial effusion is present. The pericardial effusion is circumferential. Mitral Valve: The mitral valve is normal in structure. No evidence of mitral valve regurgitation. Tricuspid Valve: The tricuspid valve is normal in structure. Tricuspid valve regurgitation is not demonstrated. No evidence of tricuspid stenosis. Aortic Valve: The aortic valve is tricuspid. Aortic valve regurgitation is not visualized. No aortic stenosis is present. Pulmonic Valve: The pulmonic valve was normal in structure. Pulmonic valve regurgitation is not visualized.  No evidence of pulmonic stenosis. Aorta: The aortic root and ascending aorta are structurally normal, with no evidence of dilitation. Venous: The inferior vena cava is normal in size  with greater than 50% respiratory variability, suggesting right atrial pressure of 3 mmHg. IAS/Shunts: The atrial septum is grossly normal.  LEFT VENTRICLE PLAX 2D LVIDd:         3.90 cm  Diastology LVIDs:         3.00 cm  LV e' medial:    10.20 cm/s LV PW:         1.10 cm  LV E/e' medial:  9.2 LV IVS:        1.10 cm  LV e' lateral:   5.77 cm/s LVOT diam:     2.30 cm  LV E/e' lateral: 16.3 LV SV:         64 LV SV Index:   29 LVOT Area:     4.15 cm  RIGHT VENTRICLE             IVC RV Basal diam:  2.30 cm     IVC diam: 2.00 cm RV S prime:     14.30 cm/s TAPSE (M-mode): 1.4 cm LEFT ATRIUM             Index       RIGHT ATRIUM           Index LA diam:        3.70 cm 1.66 cm/m  RA Area:     10.50 cm LA Vol (A2C):   43.1 ml 19.38 ml/m RA Volume:   19.70 ml  8.86 ml/m LA Vol (A4C):   47.6 ml 21.41 ml/m LA Biplane Vol: 47.1 ml 21.18 ml/m  AORTIC VALVE LVOT Vmax:   122.50 cm/s LVOT Vmean:  78.400 cm/s LVOT VTI:    0.155 m  AORTA Ao Root diam: 3.30 cm Ao Asc diam:  3.00 cm MITRAL VALVE MV Area (PHT): 4.49 cm     SHUNTS MV Decel Time: 169 msec     Systemic VTI:  0.16 m MV E velocity: 94.30 cm/s   Systemic Diam: 2.30 cm MV A velocity: 102.00 cm/s MV E/A ratio:  0.92 Riley Lam MD Electronically signed by Riley Lam MD Signature Date/Time: 12/29/2020/6:18:56 PM    Final     Cardiac Studies   Echo 12/29/20:  1. Left ventricular ejection fraction, by estimation, is 60 to 65%. The  left ventricle has normal function. The left ventricle has no regional  wall motion abnormalities. There is mild concentric left ventricular  hypertrophy. Left ventricular diastolic  parameters are indeterminate.   2. Right ventricular systolic function is normal. The right ventricular  size is normal. Tricuspid regurgitation signal is  inadequate for assessing  PA pressure.   3. The mitral valve is normal in structure. No evidence of mitral valve  regurgitation.   4. The aortic valve is tricuspid. Aortic valve regurgitation is not  visualized. No aortic stenosis is present.   5. The inferior vena cava is normal in size with greater than 50%  respiratory variability, suggesting right atrial pressure of 3 mmHg.   6. Moderate pericardial effusion. There is not evidence of increased  pericardial pressure. There is tissue Doppler annulus Reversus and an  intraventricular septal bounce, these are some, but not all of the factors  seen in constriction.   Patient Profile     55 y.o. male with CAD status post RCA PCI, hypertension, hyperlipidemia, diabetes, and recent hospitalization for pneumonia and pericarditis admitted with persistent symptoms of pericarditis.  Assessment & Plan    #Pericardial effusion: #Pericarditis: Patient has a moderate pericardial effusion.  Echo was reviewed  emergently in the in the ED and he did not have tamponade.  He was started on colchicine and scheduled ibuprofen.  He was also given a dose of Toradol and morphine, which seems to have helped.  This morning he is much more comfortable and vital signs are improving.  Plan to continue colchicine for 3 months.  Respiratory viral panel is pending.  Continue treatment of pneumonia.  #Community-acquired pneumonia: Patient has community-acquired pneumonia and partial collapse of the left lower lobe.  Encourage use of incentive parameter as tolerated.  Worsening.  Ceftriaxone and azithromycin.  Blood cultures and sputum culture pending.  Will narrow antibiotic coverage as able.  #CAD status post PCI: # Hyperlipidemia:  Not an active issue.  Resume home clopidogrel, metoprolol, Imdur, and atorvastatin.  #Essential hypertension: Home antihypertensives were initially held.  #Hyponatremia: Likely due to his pneumonia.  He appears to be euvolemic.   Continue treatment for pneumonia as above.    For questions or updates, please contact [facility] Please consult [email or web address] for contact info under        Signed, Chilton Si, MD  12/30/2020, 9:22 AM

## 2020-12-30 NOTE — Progress Notes (Signed)
Nutrition Brief Note  Patient identified on the Malnutrition Screening Tool (MST) Report  Wt Readings from Last 15 Encounters:  12/30/20 92.1 kg  02/05/18 95.7 kg  01/31/18 95.7 kg  12/06/16 93.5 kg  11/28/16 92.2 kg  06/17/15 92.5 kg  02/02/14 93 kg  07/28/13 94.1 kg  07/20/13 96.5 kg  08/18/12 75.3 kg  06/18/12 75.3 kg  04/16/12 74.8 kg  01/26/12 77.7 kg   [name] is a 55 y.o. male with a hx of peric effusion, PNA, CAD w/ no sig ISR or other dz in 2018, Coronary spasm, HTN, HLD, DMII, who is being seen 12/29/2020 for the evaluation of increased effusion at the request of Dr Rush Landmark.  Pt admitted with pericardial effusion.   Reviewed I/O's: -850 ml x 24 hours  UOP: 850 ml x 24 hours  Pt sitting up in bed, on phone at time of visit. Per discussion with nurse tech, pt has been sleeping earlier this shift.   Nutrition-Focused physical exam completed. Findings are no fat depletion, no muscle depletion, and no edema.    Current diet order is heart healthy/ carb modified, patient is consuming approximately n/a% of meals at this time. Labs and medications reviewed.   No nutrition interventions warranted at this time. If nutrition issues arise, please consult RD.   Levada Schilling, RD, LDN, CDCES Registered Dietitian II Certified Diabetes Care and Education Specialist Please refer to [facility] for RD and/or RD on-call/weekend/after hours pager

## 2020-12-30 NOTE — ED Notes (Signed)
Provided pt with water, two warm blankets, and adjusted thermostat.

## 2020-12-31 LAB — EXPECTORATED SPUTUM ASSESSMENT W GRAM STAIN, RFLX TO RESP C

## 2020-12-31 LAB — T3: T3, Total: 88 ng/dL (ref 71–180)

## 2020-12-31 LAB — PROCALCITONIN: Procalcitonin: 0.73 ng/mL

## 2020-12-31 MED ORDER — PREDNISONE 20 MG PO TABS
40.0000 mg | ORAL_TABLET | Freq: Every day | ORAL | Status: DC
  Administered 2020-12-31 – 2021-01-03 (×4): 40 mg via ORAL
  Filled 2020-12-31: qty 2
  Filled 2020-12-31: qty 4
  Filled 2020-12-31 (×2): qty 2

## 2020-12-31 MED ORDER — AZITHROMYCIN 250 MG PO TABS
500.0000 mg | ORAL_TABLET | Freq: Every day | ORAL | Status: AC
  Administered 2020-12-31 – 2021-01-03 (×4): 500 mg via ORAL
  Filled 2020-12-31 (×4): qty 2

## 2020-12-31 NOTE — Progress Notes (Signed)
Received a call from lab, needs to recollect sputum specimen . Previous  specimen insufficient.

## 2020-12-31 NOTE — Progress Notes (Signed)
Progress Note  Patient Name: Alan Copeland Date of Encounter: 12/31/2020  [facility] Cardiologist: [name], MD   Subjective   Had recurrent chest pain overnight.  Pain is worse when he takes a deep breath and lays flat.  Inpatient Medications    Scheduled Meds:  aspirin  81 mg Oral Daily   atorvastatin  20 mg Oral QHS   clopidogrel  75 mg Oral Q breakfast   colchicine  0.6 mg Oral Daily   enoxaparin (LOVENOX) injection  40 mg Subcutaneous QHS   ibuprofen  800 mg Oral TID   isosorbide mononitrate  60 mg Oral BH-q7a   metoprolol tartrate  25 mg Oral BID   pantoprazole  20 mg Oral BID   predniSONE  40 mg Oral Daily   Continuous Infusions:  sodium chloride 250 mL (12/30/20 1453)   azithromycin 500 mg (12/30/20 1456)   cefTRIAXone (ROCEPHIN)  IV Stopped (12/30/20 0939)   PRN Meds: sodium chloride, acetaminophen, ALPRAZolam, guaiFENesin, morphine injection, nitroGLYCERIN, ondansetron (ZOFRAN) IV, zolpidem   Vital Signs    Vitals:   12/31/20 0034 12/31/20 0503 12/31/20 0505 12/31/20 0719  BP: 122/79 (!) 147/76 (!) 147/76 (!) 145/83  Pulse: 80 91 91 94  Resp: Temp: 98.3 F (36.8 C) 98 F (36.7 C) 98 F (36.7 C) 98.3 F (36.8 C)  TempSrc: Oral Oral Oral Oral  SpO2: 97%  95% 97%  Weight:      Height:        Intake/Output Summary (Last 24 hours) at 12/31/2020 0847 Last data filed at 12/31/2020 0721 Gross per 24 hour  Intake 951.29 ml  Output 1100 ml  Net -148.71 ml    Last 3 Weights 12/31/2020 12/30/2020 02/05/2018  Weight (lbs) 205 lb 1.6 oz 203 lb 211 lb  Weight (kg) 93.033 kg 92.08 kg 95.709 kg      Telemetry    Sinus tachycardia-personally reviewed  ECG    None new  Physical Exam   VS:  BP (!) 145/83 (BP Location: Right Arm)   Pulse 94   Temp 98.3 F (36.8 C) (Oral)   Resp 17   Ht  (1.88 m)   Wt 93 kg   SpO2 97%   BMI 26.33 kg/m  , BMI Body mass index is 26.33 kg/m.  GEN: Well nourished, well developed, in no  acute distress  HEENT: normal  Neck: no JVD, carotid bruits, or masses Cardiac: Tachycardic, regular; no murmurs, rubs, or gallops,no edema  Respiratory:  clear to auscultation bilaterally, normal work of breathing GI: soft, nontender, nondistended, + BS MS: no deformity or atrophy  Skin: warm and dry Neuro:  Strength and sensation are intact Psych: euthymic mood, full affect'   Labs    High Sensitivity Troponin:   Recent Labs  Lab 12/29/20 1045 12/29/20 1632  TROPONINIHS 10 14       Chemistry Recent Labs  Lab 12/29/20 1045 12/29/20 2143 12/30/20 0515  NA 126*  --  129*  K 5.1  --  4.3  CL 94*  --  94*  CO2 20*  --  22  GLUCOSE 149*  --  96  BUN 10  --  11  CREATININE 1.13 1.10 1.10  CALCIUM 9.0  --  9.0  PROT 8.0  --  7.1  ALBUMIN 2.8*  --  2.5*  AST 47*  --  28  ALT 69*  --  50*  ALKPHOS 110  --  92  BILITOT 0.6  --  0.7  GFRNONAA >60 >60 >60  ANIONGAP 12  --  13      Hematology Recent Labs  Lab 12/29/20 1045 12/29/20 2143  WBC 17.5* 21.1*  RBC 6.56* 6.22*  HGB 14.1 13.5  HCT 44.1 42.4  MCV 67.2* 68.2*  MCH 21.5* 21.7*  MCHC 32.0 31.8  RDW 17.6* 17.2*  PLT 804* 631*     BNPNo results for input(s): BNP, PROBNP in the last 168 hours.   DDimer No results for input(s): DDIMER in the last 168 hours.   Radiology    DG Chest 2 View  Result Date: 12/29/2020 CLINICAL DATA:  Chest pain, shortness of breath. EXAM: CHEST - 2 VIEW COMPARISON:  December 30, 2020. FINDINGS: Stable cardiomediastinal silhouette. Mild bibasilar subsegmental atelectasis is noted with associated small pleural effusions. No pneumothorax is noted. Bony thorax is unremarkable. IMPRESSION: Mild bibasilar subsegmental atelectasis is noted with small pleural effusions. Electronically Signed   By: Lupita Raider M.D.   On: 12/29/2020 11:37   CT Angio Chest PE W and/or Wo Contrast  Result Date: 12/29/2020 CLINICAL DATA:  Chest pain and shortness of breath. Recent pneumonia and  diagnosis of pericardial effusion. EXAM: CT ANGIOGRAPHY CHEST WITH CONTRAST TECHNIQUE: Multidetector CT imaging of the chest was performed using the standard protocol during bolus administration of intravenous contrast. Multiplanar CT image reconstructions and MIPs were obtained to evaluate the vascular anatomy. CONTRAST:  87mL OMNIPAQUE IOHEXOL 350 MG/ML SOLN COMPARISON:  CT of the chest without contrast on 12/24/2020 and prior CTA of the chest on 12/20/2020 both at [facility]. FINDINGS: Cardiovascular: Large pericardial effusion present with density measurements again suggesting nonhemorrhagic fluid. Overall volume appears larger compared to the prior CT studies and there now is evidence of further narrowing of the cavity volume of both right and left ventricles suggestive of restrictive physiology/tamponade. No pericardial masses, enhancement or calcifications identified. Density related to a previously placed right coronary artery stent again noted. The thoracic aorta is normal in caliber. Central pulmonary arteries are normal in caliber. No evidence of pulmonary embolism. Mediastinum/Nodes: Enlarged right paratracheal lymph node relatively stable in size measuring approximately 15 mm in short axis. Enlarged left internal mammary lymph node appears stable measuring approximately 11 mm. Other small scattered mediastinal lymph nodes again identified. Lungs/Pleura: Persistent atelectasis/consolidation of the left lower lobe. Small left pleural effusion remains with slight decrease in volume since the prior studies. Right basilar atelectasis. No pneumothorax. No visible underlying pulmonary masses. Upper Abdomen: No acute abnormality. Musculoskeletal: No chest wall abnormality. No acute or significant osseous findings. Review of the MIP images confirms the above findings. IMPRESSION: 1. Enlargement of large pericardial effusion since prior CT studies with further evidence of narrowing of right and left  ventricular cavity suggestive of restrictive physiology/tamponade. 2. Stable enlarged right paratracheal and left internal mammary lymph nodes and other scattered small mediastinal lymph nodes. 3. Stable atelectasis/consolidation of the left lower lobe. Associated small left pleural effusion shows slight decrease in volume. Electronically Signed   By: Irish Lack M.D.   On: 12/29/2020 16:28   ECHOCARDIOGRAM COMPLETE  Result Date: 12/29/2020    ECHOCARDIOGRAM REPORT   Patient Name:   KENZEL RUESCH [facility] Date of Exam: 12/29/2020 Medical Rec #:  [id number]          Height:       74.0 in Accession #:    [id number]         Weight:  211.0 lb Date of Birth:  1966-04-15          BSA:          2.223 m Patient Age:    55 years           BP:           171/89 mmHg Patient Gender: M                  HR:           102 bpm. Exam Location:  Inpatient Procedure: 2D Echo, Cardiac Doppler and Color Doppler                       STAT ECHO Reported to: Dr. Izora Ribashandrasekhar on 12/29/2020 5:24:00 PM          Dr. Duke Salviaandolph at bedside no Definity. Indications:    Tamponade I31.4  History:        Patient has no prior history of Echocardiogram examinations.                 CAD, Signs/Symptoms:Chest Pain; Risk Factors:Hypertension,                 Diabetes and Dyslipidemia.  Sonographer:    Elmarie Shileyiffany Dance Referring Phys: [id number] PETER R BADALAMENTE IMPRESSIONS  1. Left ventricular ejection fraction, by estimation, is 60 to 65%. The left ventricle has normal function. The left ventricle has no regional wall motion abnormalities. There is mild concentric left ventricular hypertrophy. Left ventricular diastolic parameters are indeterminate.  2. Right ventricular systolic function is normal. The right ventricular size is normal. Tricuspid regurgitation signal is inadequate for assessing PA pressure.  3. The mitral valve is normal in structure. No evidence of mitral valve regurgitation.  4. The aortic valve is tricuspid. Aortic valve  regurgitation is not visualized. No aortic stenosis is present.  5. The inferior vena cava is normal in size with greater than 50% respiratory variability, suggesting right atrial pressure of 3 mmHg.  6. Moderate pericardial effusion. There is not evidence of increased pericardial pressure. There is tissue Doppler annulus Reversus and an intraventricular septal bounce, these are some, but not all of the factors seen in constriction. Comparison(s): No prior Echocardiogram. FINDINGS  Left Ventricle: Left ventricular ejection fraction, by estimation, is 60 to 65%. The left ventricle has normal function. The left ventricle has no regional wall motion abnormalities. 3D left ventricular ejection fraction analysis performed but not reported based on interpreter judgement due to suboptimal quality. The left ventricular internal cavity size was normal in size. There is mild concentric left ventricular hypertrophy. Left ventricular diastolic parameters are indeterminate. Right Ventricle: The right ventricular size is normal. No increase in right ventricular wall thickness. Right ventricular systolic function is normal. Tricuspid regurgitation signal is inadequate for assessing PA pressure. Left Atrium: Left atrial size was normal in size. Right Atrium: Right atrial size was normal in size. Pericardium: A moderately sized pericardial effusion is present. The pericardial effusion is circumferential. Mitral Valve: The mitral valve is normal in structure. No evidence of mitral valve regurgitation. Tricuspid Valve: The tricuspid valve is normal in structure. Tricuspid valve regurgitation is not demonstrated. No evidence of tricuspid stenosis. Aortic Valve: The aortic valve is tricuspid. Aortic valve regurgitation is not visualized. No aortic stenosis is present. Pulmonic Valve: The pulmonic valve was normal in structure. Pulmonic valve regurgitation is not visualized. No evidence of pulmonic stenosis. Aorta: The aortic root and  ascending aorta are  structurally normal, with no evidence of dilitation. Venous: The inferior vena cava is normal in size with greater than 50% respiratory variability, suggesting right atrial pressure of 3 mmHg. IAS/Shunts: The atrial septum is grossly normal.  LEFT VENTRICLE PLAX 2D LVIDd:         3.90 cm  Diastology LVIDs:         3.00 cm  LV e' medial:    10.20 cm/s LV PW:         1.10 cm  LV E/e' medial:  9.2 LV IVS:        1.10 cm  LV e' lateral:   5.77 cm/s LVOT diam:     2.30 cm  LV E/e' lateral: 16.3 LV SV:         64 LV SV Index:   29 LVOT Area:     4.15 cm  RIGHT VENTRICLE             IVC RV Basal diam:  2.30 cm     IVC diam: 2.00 cm RV S prime:     14.30 cm/s TAPSE (M-mode): 1.4 cm LEFT ATRIUM             Index       RIGHT ATRIUM           Index LA diam:        3.70 cm 1.66 cm/m  RA Area:     10.50 cm LA Vol (A2C):   43.1 ml 19.38 ml/m RA Volume:   19.70 ml  8.86 ml/m LA Vol (A4C):   47.6 ml 21.41 ml/m LA Biplane Vol: 47.1 ml 21.18 ml/m  AORTIC VALVE LVOT Vmax:   122.50 cm/s LVOT Vmean:  78.400 cm/s LVOT VTI:    0.155 m  AORTA Ao Root diam: 3.30 cm Ao Asc diam:  3.00 cm MITRAL VALVE MV Area (PHT): 4.49 cm     SHUNTS MV Decel Time: 169 msec     Systemic VTI:  0.16 m MV E velocity: 94.30 cm/s   Systemic Diam: 2.30 cm MV A velocity: 102.00 cm/s MV E/A ratio:  0.92 Riley Lam MD Electronically signed by Riley Lam MD Signature Date/Time: 12/29/2020/6:18:56 PM    Final     Cardiac Studies   Echo 12/29/20:  1. Left ventricular ejection fraction, by estimation, is 60 to 65%. The  left ventricle has normal function. The left ventricle has no regional  wall motion abnormalities. There is mild concentric left ventricular  hypertrophy. Left ventricular diastolic  parameters are indeterminate.   2. Right ventricular systolic function is normal. The right ventricular  size is normal. Tricuspid regurgitation signal is inadequate for assessing  PA pressure.   3. The mitral valve  is normal in structure. No evidence of mitral valve  regurgitation.   4. The aortic valve is tricuspid. Aortic valve regurgitation is not  visualized. No aortic stenosis is present.   5. The inferior vena cava is normal in size with greater than 50%  respiratory variability, suggesting right atrial pressure of 3 mmHg.   6. Moderate pericardial effusion. There is not evidence of increased  pericardial pressure. There is tissue Doppler annulus Reversus and an  intraventricular septal bounce, these are some, but not all of the factors  seen in constriction.   Patient Profile     55 y.o. male with CAD status post RCA PCI, hypertension, hyperlipidemia, diabetes, and recent hospitalization for pneumonia and pericarditis admitted with persistent symptoms of pericarditis.  Assessment & Plan    #  Pericardial effusion: #Pericarditis: Patient with a moderate pericardial effusion, though no tamponade based on echo in the emergency room.  Currently on colchicine and high-dose ibuprofen.  He is continuing to have severe pain.  We Takeira Yanes start him on a short dose of prednisone 40 mg for 5 days.  #Community-acquired pneumonia: Continue antibiotics, ceftriaxone and azithromycin.  Encourage and incentive spirometry.  #CAD status post PCI: # Hyperlipidemia:  No current chest pain.  Continue Plavix, metoprolol, Imdur  #Essential hypertension: Blood pressure mildly elevated.  No changes.  #Hyponatremia: Likely due to pneumonia.  Treating as above.  For questions or updates, please contact [facility] Please consult [email or web address] for contact info under        Signed, Keddrick Wyne Jorja Loa, MD  12/31/2020, 8:47 AM

## 2020-12-31 NOTE — Plan of Care (Signed)
  Problem: Activity: Goal: Capacity to carry out activities will improve Outcome: Progressing   Problem: Education: Goal: Knowledge of General Education information will improve Description: Including pain rating scale, medication(s)/side effects and non-pharmacologic comfort measures Outcome: Progressing   Problem: Clinical Measurements: Goal: Ability to maintain clinical measurements within normal limits will improve Outcome: Progressing Goal: Respiratory complications will improve Outcome: Progressing Goal: Cardiovascular complication will be avoided Outcome: Progressing   Problem: Activity: Goal: Risk for activity intolerance will decrease Outcome: Progressing   Problem: Nutrition: Goal: Adequate nutrition will be maintained Outcome: Progressing   Problem: Coping: Goal: Level of anxiety will decrease Outcome: Progressing   Problem: Elimination: Goal: Will not experience complications related to bowel motility Outcome: Progressing Goal: Will not experience complications related to urinary retention Outcome: Progressing   Problem: Safety: Goal: Ability to remain free from injury will improve Outcome: Progressing   Problem: Skin Integrity: Goal: Risk for impaired skin integrity will decrease Outcome: Progressing

## 2021-01-01 LAB — GLUCOSE, CAPILLARY: Glucose-Capillary: 112 mg/dL — ABNORMAL HIGH (ref 70–99)

## 2021-01-01 NOTE — Progress Notes (Signed)
Progress Note  Patient Name: Alan Copeland Date of Encounter: 01/01/2021  [facility] Cardiologist: [name], MD   Subjective   Steroids started yesterday.  Pain improved and he slept until approximately 3:00 this morning.  He does feel better than he did yesterday.  Left chest pain.  Inpatient Medications    Scheduled Meds:  aspirin  81 mg Oral Daily   atorvastatin  20 mg Oral QHS   azithromycin  500 mg Oral Daily   clopidogrel  75 mg Oral Q breakfast   colchicine  0.6 mg Oral Daily   enoxaparin (LOVENOX) injection  40 mg Subcutaneous QHS   ibuprofen  800 mg Oral TID   isosorbide mononitrate  60 mg Oral BH-q7a   metoprolol tartrate  25 mg Oral BID   pantoprazole  20 mg Oral BID   predniSONE  40 mg Oral Daily   Continuous Infusions:  sodium chloride 250 mL (12/30/20 1453)   cefTRIAXone (ROCEPHIN)  IV 1 g (12/31/20 0903)   PRN Meds: sodium chloride, acetaminophen, ALPRAZolam, guaiFENesin, morphine injection, nitroGLYCERIN, ondansetron (ZOFRAN) IV, zolpidem   Vital Signs    Vitals:   12/31/20 0719 12/31/20 1643 12/31/20 2034 01/01/21 0500  BP: (!) 145/83 132/83 (!) 144/78   Pulse: 94 86 86   Resp: 17 18 18    Temp: 98.3 F (36.8 C) 98 F (36.7 C) 98.3 F (36.8 C)   TempSrc: Oral Oral Oral   SpO2: 97% 99% 97%   Weight:    91.7 kg  Height:        Intake/Output Summary (Last 24 hours) at 01/01/2021 0805 Last data filed at 12/31/2020 2030 Gross per 24 hour  Intake 1080 ml  Output 460 ml  Net 620 ml    Last 3 Weights 01/01/2021 12/31/2020 12/30/2020  Weight (lbs) 202 lb 3.2 oz 205 lb 1.6 oz 203 lb  Weight (kg) 91.717 kg 93.033 kg 92.08 kg      Telemetry    Sinus tachycardia-personally reviewed  ECG    None new  Physical Exam   VS:  BP (!) 144/78 (BP Location: Right Arm)   Pulse 86   Temp 98.3 F (36.8 C) (Oral)   Resp 18   Ht 6\' 2"  (1.88 m)   Wt 91.7 kg Comment: scale c  SpO2 97%   BMI 25.96 kg/m  , BMI Body mass index is 25.96  kg/m.  GEN: Well nourished, well developed, in no acute distress  HEENT: normal  Neck: no JVD, carotid bruits, or masses Cardiac: Tachycardic; no murmurs, rubs, or gallops,no edema  Respiratory:  clear to auscultation bilaterally, normal work of breathing GI: soft, nontender, nondistended, + BS MS: no deformity or atrophy  Skin: warm and dry Neuro:  Strength and sensation are intact Psych: euthymic mood, full affect    Labs    High Sensitivity Troponin:   Recent Labs  Lab 12/29/20 1045 12/29/20 1632  TROPONINIHS 10 14       Chemistry Recent Labs  Lab 12/29/20 1045 12/29/20 2143 12/30/20 0515  NA 126*  --  129*  K 5.1  --  4.3  CL 94*  --  94*  CO2 20*  --  22  GLUCOSE 149*  --  96  BUN 10  --  11  CREATININE 1.13 1.10 1.10  CALCIUM 9.0  --  9.0  PROT 8.0  --  7.1  ALBUMIN 2.8*  --  2.5*  AST 47*  --  28  ALT 69*  --  50*  ALKPHOS 110  --  92  BILITOT 0.6  --  0.7  GFRNONAA >60 >60 >60  ANIONGAP 12  --  13      Hematology Recent Labs  Lab 12/29/20 1045 12/29/20 2143  WBC 17.5* 21.1*  RBC 6.56* 6.22*  HGB 14.1 13.5  HCT 44.1 42.4  MCV 67.2* 68.2*  MCH 21.5* 21.7*  MCHC 32.0 31.8  RDW 17.6* 17.2*  PLT 804* 631*     BNPNo results for input(s): BNP, PROBNP in the last 168 hours.   DDimer No results for input(s): DDIMER in the last 168 hours.   Radiology    No results found.  Cardiac Studies   Echo 12/29/20:  1. Left ventricular ejection fraction, by estimation, is 60 to 65%. The  left ventricle has normal function. The left ventricle has no regional  wall motion abnormalities. There is mild concentric left ventricular  hypertrophy. Left ventricular diastolic  parameters are indeterminate.   2. Right ventricular systolic function is normal. The right ventricular  size is normal. Tricuspid regurgitation signal is inadequate for assessing  PA pressure.   3. The mitral valve is normal in structure. No evidence of mitral valve   regurgitation.   4. The aortic valve is tricuspid. Aortic valve regurgitation is not  visualized. No aortic stenosis is present.   5. The inferior vena cava is normal in size with greater than 50%  respiratory variability, suggesting right atrial pressure of 3 mmHg.   6. Moderate pericardial effusion. There is not evidence of increased  pericardial pressure. There is tissue Doppler annulus Reversus and an  intraventricular septal bounce, these are some, but not all of the factors  seen in constriction.   Patient Profile     55 y.o. male with CAD status post RCA PCI, hypertension, hyperlipidemia, diabetes, and recent hospitalization for pneumonia and pericarditis admitted with persistent symptoms of pericarditis.  Assessment & Plan    #Pericardial effusion: #Pericarditis: Patient with a moderate pericardial effusion though no tamponade.  Currently on colchicine and high-dose ibuprofen.  Reportedly continued to have severe pain despite optimal therapy.  He was started on prednisone yesterday with improvement in his pain.  Hopefully a short course of prednisone Alli Jasmer be sufficient.  We Keiton Cosma keep him in the hospital overnight tonight.  If he continues to feel well by tomorrow, potential discharge with close follow-up in clinic.    #Community-acquired pneumonia: Continue current antibiotics.  #CAD status post PCI: # Hyperlipidemia:  No ischemic symptoms.  Continue Plavix, metoprolol, Imdur  #Essential hypertension: Mildly elevated.  Potentially due to pain.  Continue current management.  #Hyponatremia: Has remained stable since admission.  No changes.  For questions or updates, please contact [facility] Please consult [email or web address] for contact info under        Signed, Josie Mesa Jorja Loa, MD  01/01/2021, 8:05 AM

## 2021-01-01 NOTE — Plan of Care (Signed)

## 2021-01-02 DIAGNOSIS — I309 Acute pericarditis, unspecified: Secondary | ICD-10-CM

## 2021-01-02 DIAGNOSIS — J918 Pleural effusion in other conditions classified elsewhere: Secondary | ICD-10-CM

## 2021-01-02 LAB — COMPREHENSIVE METABOLIC PANEL
ALT: 107 U/L — ABNORMAL HIGH (ref 0–44)
AST: 102 U/L — ABNORMAL HIGH (ref 15–41)
Albumin: 2.1 g/dL — ABNORMAL LOW (ref 3.5–5.0)
Alkaline Phosphatase: 97 U/L (ref 38–126)
Anion gap: 9 (ref 5–15)
BUN: 9 mg/dL (ref 6–20)
CO2: 25 mmol/L (ref 22–32)
Calcium: 8.2 mg/dL — ABNORMAL LOW (ref 8.9–10.3)
Chloride: 99 mmol/L (ref 98–111)
Creatinine, Ser: 0.96 mg/dL (ref 0.61–1.24)
GFR, Estimated: >60 mL/min (ref >60)
Glucose, Bld: 106 mg/dL — ABNORMAL HIGH (ref 70–99)
Potassium: 3.4 mmol/L — ABNORMAL LOW (ref 3.5–5.1)
Sodium: 133 mmol/L — ABNORMAL LOW (ref 135–145)
Total Bilirubin: 0.5 mg/dL (ref 0.3–1.2)
Total Protein: 6.9 g/dL (ref 6.5–8.1)

## 2021-01-02 LAB — SEDIMENTATION RATE: Sed Rate: 80 mm/hr — ABNORMAL HIGH (ref 0–16)

## 2021-01-02 LAB — C-REACTIVE PROTEIN: CRP: 11 mg/dL — ABNORMAL HIGH (ref <1.0)

## 2021-01-02 NOTE — Plan of Care (Signed)

## 2021-01-02 NOTE — TOC Progression Note (Signed)
Transition of Care [facility]) - Progression Note    Patient Details  Name: Alan Copeland MRN: [id number] Date of Birth: 1966-05-11  [facility] CM/SW Contact  Leone Haven, RN Phone Number: 01/02/2021, 3:11 PM  Clinical Narrative:    Patient is from home with wife.  NCM spoke with patient he does not want any HH services , he is independent at home.  TOC will continue to follow for dc needs.        Expected Discharge Plan and Services                                                 Social Determinants of Health (SDOH) Interventions    Readmission Risk Interventions No flowsheet data found.

## 2021-01-02 NOTE — Progress Notes (Addendum)
Progress Note  Patient Name: Alan Copeland Date of Encounter: 01/02/2021  [facility] Cardiologist: Jenkins Rouge, MD   Subjective   Pain is a 4/10 prior to morning medications. He points to pain in his right lower lateral rib area. He denies chest pain.  Inpatient Medications    Scheduled Meds:  aspirin  81 mg Oral Daily   atorvastatin  20 mg Oral QHS   azithromycin  500 mg Oral Daily   clopidogrel  75 mg Oral Q breakfast   colchicine  0.6 mg Oral Daily   enoxaparin (LOVENOX) injection  40 mg Subcutaneous QHS   ibuprofen  800 mg Oral TID   isosorbide mononitrate  60 mg Oral BH-q7a   metoprolol tartrate  25 mg Oral BID   pantoprazole  20 mg Oral BID   predniSONE  40 mg Oral Daily   Continuous Infusions:  sodium chloride 250 mL (12/30/20 1453)   cefTRIAXone (ROCEPHIN)  IV 1 g (01/01/21 0945)   PRN Meds: sodium chloride, acetaminophen, ALPRAZolam, guaiFENesin, morphine injection, nitroGLYCERIN, ondansetron (ZOFRAN) IV, zolpidem   Vital Signs    Vitals:   01/01/21 0500 01/01/21 1038 01/01/21 1947 01/02/21 0430  BP:  138/73 125/70 (!) 144/70  Pulse:  85 86 70  Resp:  _0 Temp:  98.1 F (36.7 C) 97.9 F (36.6 C) 98 F (36.7 C)  TempSrc:  Oral Oral Oral  SpO2:  98% 99% 97%  Weight: 91.7 kg   91.6 kg  Height:        Intake/Output Summary (Last 24 hours) at 01/02/2021 0845 Last data filed at 01/02/2021 0300 Gross per 24 hour  Intake 840 ml  Output 1050 ml  Net -210 ml   Last 3 Weights 01/02/2021 01/01/2021 12/31/2020  Weight (lbs) 202 lb 202 lb 3.2 oz 205 lb 1.6 oz  Weight (kg) 91.627 kg 91.717 kg 93.033 kg      Telemetry    Sinus rhythm in the 60-70s - Personally Reviewed  ECG    No new tracings - Personally Reviewed  Physical Exam   GEN: No acute distress.   Neck: No JVD Cardiac: RRR, no murmurs, rubs, or gallops.  Respiratory: clear except right lower lobe - rhonchi/crackles, diminished GI: Soft, nontender, non-distended  MS: No  edema; No deformity. Neuro:  Nonfocal  Psych: Normal affect   Labs    High Sensitivity Troponin:   Recent Labs  Lab 12/29/20 1045 12/29/20 1632  TROPONINIHS 10 14      Chemistry Recent Labs  Lab 12/29/20 1045 12/29/20 2143 12/30/20 0515  NA 126*  --  129*  K 5.1  --  4.3  CL 94*  --  94*  CO2 20*  --  22  GLUCOSE 149*  --  96  BUN 10  --  11  CREATININE 1.13 1.10 1.10  CALCIUM 9.0  --  9.0  PROT 8.0  --  7.1  ALBUMIN 2.8*  --  2.5*  AST 47*  --  28  ALT 69*  --  50*  ALKPHOS 110  --  92  BILITOT 0.6  --  0.7  GFRNONAA >60 >60 >60  ANIONGAP 12  --  13     Hematology Recent Labs  Lab 12/29/20 1045 12/29/20 2143  WBC 17.5* 21.1*  RBC 6.56* 6.22*  HGB 14.1 13.5  HCT 44.1 42.4  MCV 67.2* 68.2*  MCH 21.5* 21.7*  MCHC 32.0 31.8  RDW 17.6* 17.2*  PLT 804* 631*  BNPNo results for input(s): BNP, PROBNP in the last 168 hours.   DDimer No results for input(s): DDIMER in the last 168 hours.   Radiology    No results found.  Cardiac Studies   Echo     Patient Profile     55 y.o. male with a history of CAD s/p PCI/DES to RCA in 2013 with ISR in 2015 treated with DES to RCA, last cath in 2018 with patent stents, coronary artery spasm, HLD, HTN, and DM2 who was admitted 12/29/20 for increased pericardial effusion and chest pain.  He was hospitalized at [facility] 12/20/20 with sepsis and PNA with pericardial effusion. Elevated ESR and CRP treated with prednisone. He was discharged but continued to have CP and dyspnea prompting re-evaluation at [facility]. Echo in ED reviewed and showed moderate pericardial effusion but no signs of tamponade.   Assessment & Plan    Pericarditis Pericardial effusion - he was treated with prednisone, but not colchicine, during his recent hospitalization; does not appear he was discharged on a taper - he was started on colchicine and ibuprofen this admission along with ABX for PNA, but continues to have chest discomfort  - given  symptoms on optimal therapy, prednisone restarted 12/31/20 - he states his pain is now a 4/10 and resolves to a 2/10 after morning medications --> return of CP and back pain in the evenings requiring IV morphine - morphine was reduced to 2 doses on 7/24 from 5 doses on 7/23, received 0726 today - the pain he describes is in his right lower lobe, not chest pain or pain between his shoulder blades - suspect this is related to his PNA and less so to pericarditis - he is now able to take a deep breath without pain - prednisone during recent hospitalization with OP taper may have precipitated recurrence --> will likely need a very long outpatient taper - repeat echo in 1-2 weeks  - CRP 27.9, sed rate 21 - will repeat labs today   CAP - on PO zithromax and IV rocephin - per pharmD - continue both for 5 days - he describes back pain, but with further questioning, he points to right lower lobe - encouraged him to walk in the hall today - productive cough, continue IS   Elevated TSH - TSH 4.7-5.7 - free T4 elevated to 1.61 - will refer to PCP vs endocrinology   CAD Hypertension Hyperlipidemia with LDL goal < 70 - DES to RCA in 2013 with ISR in 2015 treated with DES - continue imdur, metoprolol - home lisinopril-HCTZ on hold for now - continue plavix monotherapy - continue 20 mg lipitor - will need updated fasting lipid panel   Elevated ALT - 69 --> 50 - repeat CMP today   Disposition planning - will likely require 6 months of colchicine if this is considered a recurrence - begin prednisone taper in 2-4 weeks if CRP has normalized - taper by 5-10 mg/day every 2 weeks as long as asymptomatic and CRP does not elevate - encouraged him to ambulate in the hall today - will likely need to keep him overnight as long as he is requiring IV pain medication - echo in 1-2 weeks - will make him a follow up in 2 weeks with Dr. Johnsie Cancel   For questions or updates, please contact [facility] Please consult [email or web address] for contact info under        Signed, Ledora Bottcher, PA  01/02/2021, 8:45 AM  I have seen and examined the patient along with Ledora Bottcher, PA .  I have reviewed the chart, notes and new data.  I agree with PA/NP's note.  Key new complaints: Continues to have pleuritic chest pain, primarily in the right lower chest.  Nonproductive cough.  Pain still increases to 7/10 in intensity every morning and improves after he receives oral medications every morning (ibuprofen, prednisone and colchicine are among these).  Intravenous analgesics only provide mild relief. Key examination changes: No pleural or pericardial rub is heard, RRR Key new findings / data: Need to repeat the BMET since he was moderately hyponatremic on admission.  Recheck inflammatory markers and WBC as well.  PLAN: I doubt that he will need intravenous antibiotics beyond the next 24 hours and the intravenous analgesics are not really helping. Likely that he will have a slow recovery requiring anti-inflammatory medicines for a few weeks and colchicine to prevent pericardial effusion recurrence for several months. Recheck inflammatory markers and routine chemistries in AM.  Sanda Klein, MD, [place] [phone] 01/02/2021, 10:21 AM

## 2021-01-03 ENCOUNTER — Other Ambulatory Visit: Payer: Self-pay | Admitting: Physician Assistant

## 2021-01-03 ENCOUNTER — Other Ambulatory Visit (HOSPITAL_COMMUNITY): Payer: Self-pay

## 2021-01-03 DIAGNOSIS — I10 Essential (primary) hypertension: Secondary | ICD-10-CM

## 2021-01-03 DIAGNOSIS — K219 Gastro-esophageal reflux disease without esophagitis: Secondary | ICD-10-CM

## 2021-01-03 DIAGNOSIS — I313 Pericardial effusion (noninflammatory): Secondary | ICD-10-CM

## 2021-01-03 DIAGNOSIS — I25111 Atherosclerotic heart disease of native coronary artery with angina pectoris with documented spasm: Secondary | ICD-10-CM

## 2021-01-03 DIAGNOSIS — R7401 Elevation of levels of liver transaminase levels: Secondary | ICD-10-CM

## 2021-01-03 DIAGNOSIS — I3139 Other pericardial effusion (noninflammatory): Secondary | ICD-10-CM

## 2021-01-03 LAB — CBC
HCT: 35.4 % — ABNORMAL LOW (ref 39.0–52.0)
Hemoglobin: 11.3 g/dL — ABNORMAL LOW (ref 13.0–17.0)
MCH: 21.3 pg — ABNORMAL LOW (ref 26.0–34.0)
MCHC: 31.9 g/dL (ref 30.0–36.0)
MCV: 66.8 fL — ABNORMAL LOW (ref 80.0–100.0)
Platelets: 553 10*3/uL — ABNORMAL HIGH (ref 150–400)
RBC: 5.3 MIL/uL (ref 4.22–5.81)
RDW: 16 % — ABNORMAL HIGH (ref 11.5–15.5)
WBC: 5.1 10*3/uL (ref 4.0–10.5)
nRBC: 0 % (ref 0.0–0.2)

## 2021-01-03 LAB — C-REACTIVE PROTEIN: CRP: 7.2 mg/dL — ABNORMAL HIGH (ref <1.0)

## 2021-01-03 LAB — SEDIMENTATION RATE: Sed Rate: 73 mm/hr — ABNORMAL HIGH (ref 0–16)

## 2021-01-03 MED ORDER — PREDNISONE 10 MG PO TABS
ORAL_TABLET | ORAL | 0 refills | Status: DC
  Filled 2021-01-03: qty 98, 28d supply, fill #0

## 2021-01-03 MED ORDER — IBUPROFEN 800 MG PO TABS
800.0000 mg | ORAL_TABLET | Freq: Three times a day (TID) | ORAL | 0 refills | Status: DC
  Filled 2021-01-03: qty 30, 10d supply, fill #0

## 2021-01-03 MED ORDER — COLCHICINE 0.6 MG PO CAPS
0.6000 mg | ORAL_CAPSULE | Freq: Every day | ORAL | 1 refills | Status: DC
  Filled 2021-01-03: qty 30, 30d supply, fill #0

## 2021-01-03 MED ORDER — GUAIFENESIN 100 MG/5ML PO SOLN: 5.0000 mL | ORAL | 0 refills | Status: DC | PRN

## 2021-01-03 MED ORDER — IBUPROFEN 800 MG PO TABS: 800.0000 mg | ORAL_TABLET | Freq: Three times a day (TID) | ORAL | 0 refills | Status: DC

## 2021-01-03 MED ORDER — ATORVASTATIN CALCIUM 20 MG PO TABS: 20.0000 mg | ORAL_TABLET | Freq: Every day | ORAL | Status: AC

## 2021-01-03 NOTE — Progress Notes (Signed)
Discharge and medication education given with teach back. Questions and concerns answered.  Peripheral iv removed. Medications with pt from TOC.   Letter for work printed and given to pt.

## 2021-01-03 NOTE — Discharge Summary (Signed)
Discharge Summary    Patient ID: Alan Copeland MRN: [id number]; DOB: Oct 14, 1965  Admit date: 12/29/2020 Discharge date: 01/03/2021  PCP:  [facility], [place] Prov[facility] Cardiologist:  Jenkins Rouge, MD   Discharge Diagnoses    Principal Problem:   Pericardial effusion Active Problems:   Hypertension   Gastro-esophageal reflux disease without esophagitis   CAD (coronary artery disease)   HLD (hyperlipidemia)   Recurrent idiopathic pericarditis   Transaminitis    Diagnostic Studies/Procedures    Echo 12/29/20:  1. Left ventricular ejection fraction, by estimation, is 60 to 65%. The  left ventricle has normal function. The left ventricle has no regional  wall motion abnormalities. There is mild concentric left ventricular  hypertrophy. Left ventricular diastolic  parameters are indeterminate.   2. Right ventricular systolic function is normal. The right ventricular  size is normal. Tricuspid regurgitation signal is inadequate for assessing  PA pressure.   3. The mitral valve is normal in structure. No evidence of mitral valve  regurgitation.   4. The aortic valve is tricuspid. Aortic valve regurgitation is not  visualized. No aortic stenosis is present.   5. The inferior vena cava is normal in size with greater than 50%  respiratory variability, suggesting right atrial pressure of 3 mmHg.   6. Moderate pericardial effusion. There is not evidence of increased  pericardial pressure. There is tissue Doppler annulus Reversus and an  intraventricular septal bounce, these are some, but not all of the factors  seen in constriction. _____________   History of Present Illness     Alan Copeland is a 55 y.o. male with a history of CAD s/p PCI/DES to RCA in 2013 with ISR in 2015 treated with DES to RCA, last cath in 2018 with patent stents, coronary artery spasm, HLD, HTN, and DM2 who was admitted 12/29/20 for increased pericardial  effusion and chest pain.  His most recent cath 11/2016 revealed stable disease and a patent stent.  He was hospitalized at [facility] on 7/12 with sepsis and pneumonia.  He was noted to have a pericardial effusion.  He was started on 5 days of prednisone for elevated ESR (102) and CRP (356).  He reported chills but no fever and a cough productive of white sputum.   He was treated with IV ceftriaxone and azithromycin.  He was also noted to have a left pleural effusion and left lower lobe and lingula collapse.  There is also right lung atelectasis.  He continued to have discomfort that worsened after discharge, as well as increasing shortness of breath so he presented to [facility].   In the ED he was noted to be an extremis.  He was tachycardic to the 100s.  WBC 17.5, platelets 804.  He was given ceftriaxone and azithryomycin.  Cardiology was called due to concern for tamponade and pericardial effusion noted on chest CT that seemed worse than his prior admission.  Dr. Lucretia Field personally reviewed his echocardiogram in the room and he does not have tamponade.  He does have respiratory inflow variation but his IVC is not dilated and his right ventricle is not collapsing.  He received prednisone during his prior hospitalization but did not receive any colchicine.  This does raise the risk of worsening and recurrent pericarditis.  We will get him started on colchicine 0.6 mg twice daily for 1 day followed by once daily.  We will also give him Toradol now for his acute  discomfort and then start him on ibuprofen 800 mg every 8 hours.  We will check a high-sensitivity troponin due to concern for myocarditis as there are some mild changes on his EKG.  Additionally, we will check a respiratory viral panel and order an incentive spirometer given the atelectasis and lung collapse on his chest x-ray/CT.   Hospital Course     Consultants: none  Pericarditis Pericardial effusion Treated with prednisone but no  colchicine during recent hospitalization in [place] and was not discharged on a taper. He was started on colchicine and ibuprofen this admission along with ABX for PNA. On optimal therapy, he continued to have chest pain and was restarted on prednisone. Yesterday, he described right lateral lower rib regions discomfort, not classical pericarditis pain. Suspect this is more related to his PNA. No further IV morphine.     CRP is trending down 27.9 --> 11 --> 7.2 Sed rate is trending down 21 --> 80 --> 73 WBC resolved 21.1 --> 5.0   Suspect he will need several weeks of ibuprofen, 3-6 months of colchicine, and a very long prednisone taper (starting at 40 mg/day, taper 10 mg/day every 2 weeks as long as asymptomatic and inflammatory markers remain low. Will repeat limited echo to monitor moderate pericardial effusion. He is abel to easily get to [place] - will arrange limited echo there.       CAP Completed 5 days of IV rocephine. Has had 4 days of PO zithromax following initial IV dose. Length of therapy x 5 days per pharmD.   Continue IS at home and ambulation. Follow with PCP next week. Lung sounds improving. Continue IS at home.     Elevated TSH TSH 4.7-5.7. Elevated free T4 to 1.61. Will defer to PCP.     CAD HTN HLD with LDL goal < 70 DES to RCA in 2013 with ISR in 2015 treated with DES. Continue imdur and metoprolol. Will pause lipitor therapy while taking colchicine. Resume when colchicine therapy has ended. Resume home anti-hypertensives.     Elevated LFTs AST 28 --> 102 ALT 50 --> 107 I do not see a medication reason for elevated LFTs. Colchicine has reports of hepatotoxicity, but no strong link. NSAIDs may be contributing. He denies IVDU, no alcohol. May be related to his viral illness. Will repeat CMP at PCP office next week and at [facility] follow up.    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 I have advised him to  stay out of work until he is seen in follow up. I will provide a letter.    Did the patient have an acute coronary syndrome (MI, NSTEMI, STEMI, etc) this admission?:  No                               Did the patient have a percutaneous coronary intervention (stent / angioplasty)?:  No.       _____________  Discharge Vitals Blood pressure 114/67, pulse 78, temperature 99.6 F (37.6 C), temperature source Oral, resp. rate 18, height 6' 2"  (1.88 m), weight 91.9 kg, SpO2 96 %.  Filed Weights   01/01/21 0500 01/02/21 0430 01/03/21 0256  Weight: 91.7 kg 91.6 kg 91.9 kg    Labs & Radiologic Studies    CBC Recent Labs    01/03/21 0338  WBC 5.1  HGB 11.3*  HCT 35.4*  MCV 66.8*  PLT 277*   Basic Metabolic Panel Recent Labs    01/02/21 1038  NA 133*  K 3.4*  CL 99  CO2 25  GLUCOSE 106*  BUN 9  CREATININE 0.96  CALCIUM 8.2*   Liver Function Tests Recent Labs    01/02/21 1038  AST 102*  ALT 107*  ALKPHOS 97  BILITOT 0.5  PROT 6.9  ALBUMIN 2.1*   No results for input(s): LIPASE, AMYLASE in the last 72 hours. High Sensitivity Troponin:   Recent Labs  Lab 12/29/20 1045 12/29/20 1632  TROPONINIHS 10 14    BNP Invalid input(s): POCBNP D-Dimer No results for input(s): DDIMER in the last 72 hours. Hemoglobin A1C No results for input(s): HGBA1C in the last 72 hours. Fasting Lipid Panel No results for input(s): CHOL, HDL, LDLCALC, TRIG, CHOLHDL, LDLDIRECT in the last 72 hours. Thyroid Function Tests No results for input(s): TSH, T4TOTAL, T3FREE, THYROIDAB in the last 72 hours.  Invalid input(s): FREET3 _____________  DG Chest 2 View  Result Date: 12/29/2020 CLINICAL DATA:  Chest pain, shortness of breath. EXAM: CHEST - 2 VIEW COMPARISON:  December 30, 2020. FINDINGS: Stable cardiomediastinal silhouette. Mild bibasilar subsegmental atelectasis is noted with associated small pleural effusions. No pneumothorax is noted. Bony thorax is unremarkable. IMPRESSION: Mild  bibasilar subsegmental atelectasis is noted with small pleural effusions. Electronically Signed   By: Marijo Conception M.D.   On: 12/29/2020 11:37   CT Angio Chest PE W and/or Wo Contrast  Result Date: 12/29/2020 CLINICAL DATA:  Chest pain and shortness of breath. Recent pneumonia and diagnosis of pericardial effusion. EXAM: CT ANGIOGRAPHY CHEST WITH CONTRAST TECHNIQUE: Multidetector CT imaging of the chest was performed using the standard protocol during bolus administration of intravenous contrast. Multiplanar CT image reconstructions and MIPs were obtained to evaluate the vascular anatomy. CONTRAST:  30m OMNIPAQUE IOHEXOL 350 MG/ML SOLN COMPARISON:  CT of the chest without contrast on 12/24/2020 and [facility] FINDINGS: Cardiovascular: Large pericardial effusion present with density measurements again suggesting nonhemorrhagic fluid. Overall volume appears larger compared to the prior CT studies and there now is evidence of further narrowing of the cavity volume of both right and left ventricles suggestive of restrictive physiology/tamponade. No pericardial masses, enhancement or calcifications identified. Density related to a previously placed right coronary artery stent again noted. The thoracic aorta is normal in caliber. Central pulmonary arteries are normal in caliber. No evidence of pulmonary embolism. Mediastinum/Nodes: Enlarged right paratracheal lymph node relatively stable in size measuring approximately 15 mm in short axis. Enlarged left internal mammary lymph node appears stable measuring approximately 11 mm. Other small scattered mediastinal lymph nodes again identified. Lungs/Pleura: Persistent atelectasis/consolidation of the left lower lobe. Small left pleural effusion remains with slight decrease in volume since the prior studies. Right basilar atelectasis. No pneumothorax. No visible underlying pulmonary masses. Upper Abdomen: No acute  abnormality. Musculoskeletal: No chest wall abnormality. No acute or significant osseous findings. Review of the MIP images confirms the above findings. IMPRESSION: 1. Enlargement of large pericardial effusion since prior CT studies with further evidence of narrowing of right and left ventricular cavity suggestive of restrictive physiology/tamponade. 2. Stable enlarged right paratracheal and left internal mammary lymph nodes and other scattered small mediastinal lymph nodes. 3. Stable atelectasis/consolidation of the left lower lobe. Associated small left pleural effusion shows slight decrease in volume. Electronically Signed   By: GAletta EdouardM.D.   On: 12/29/2020 16:28  ECHOCARDIOGRAM COMPLETE  Result Date: 12/29/2020    ECHOCARDIOGRAM REPORT   Patient Name:   Alan Copeland [facility] Date of Exam: 12/29/2020 Medical Rec #:  [id number]          Height:       74.0 in Accession #:    [id number]         Weight:       211.0 lb Date of Birth:  1965/10/26          BSA:          2.223 m Patient Age:    55 years           BP:           171/89 mmHg Patient Gender: M                  HR:           102 bpm. Exam Location:  Inpatient Procedure: 2D Echo, Cardiac Doppler and Color Doppler                       STAT ECHO Reported to: Dr. Gasper Sells on 12/29/2020 5:24:00 PM          Dr. Oval Linsey at bedside no Def[place]. Indications:    Tamponade I31.4  History:        Patient has no prior history of Echocardiogram examinations.                 CAD, Signs/Symptoms:Chest Pain; Risk Factors:Hypertension,                 Diabetes and Dyslipidemia.  Sonographer:    Jonelle Sidle Dance Referring Phys: [id number] [place]  1. Left ventricular ejection fraction, by estimation, is 60 to 65%. The left ventricle has normal function. The left ventricle has no regional wall motion abnormalities. There is mild concentric left ventricular hypertrophy. Left ventricular diastolic parameters are indeterminate.  2. Right ventricular  systolic function is normal. The right ventricular size is normal. Tricuspid regurgitation signal is inadequate for assessing PA pressure.  3. The mitral valve is normal in structure. No evidence of mitral valve regurgitation.  4. The aortic valve is tricuspid. Aortic valve regurgitation is not visualized. No aortic stenosis is present.  5. The inferior vena cava is normal in size with greater than 50% respiratory variability, suggesting right atrial pressure of 3 mmHg.  6. Moderate pericardial effusion. There is not evidence of increased pericardial pressure. There is tissue Doppler annulus Reversus and an intraventricular septal bounce, these are some, but not all of the factors seen in constriction. Comparison(s): No prior Echocardiogram. FINDINGS  Left Ventricle: Left ventricular ejection fraction, by estimation, is 60 to 65%. The left ventricle has normal function. The left ventricle has no regional wall motion abnormalities. 3D left ventricular ejection fraction analysis performed but not reported based on interpreter judgement due to suboptimal quality. The left ventricular internal cavity size was normal in size. There is mild concentric left ventricular hypertrophy. Left ventricular diastolic parameters are indeterminate. Right Ventricle: The right ventricular size is normal. No increase in right ventricular wall thickness. Right ventricular systolic function is normal. Tricuspid regurgitation signal is inadequate for assessing PA pressure. Left Atrium: Left atrial size was normal in size. Right Atrium: Right atrial size was normal in size. Pericardium: A moderately sized pericardial effusion is present. The pericardial effusion is circumferential. Mitral Valve: The mitral valve is normal in structure. No evidence of mitral valve  regurgitation. Tricuspid Valve: The tricuspid valve is normal in structure. Tricuspid valve regurgitation is not demonstrated. No evidence of tricuspid stenosis. Aortic Valve: The  aortic valve is tricuspid. Aortic valve regurgitation is not visualized. No aortic stenosis is present. Pulmonic Valve: The pulmonic valve was normal in structure. Pulmonic valve regurgitation is not visualized. No evidence of pulmonic stenosis. Aorta: The aortic root and ascending aorta are structurally normal, with no evidence of dilitation. Venous: The inferior vena cava is normal in size with greater than 50% respiratory variability, suggesting right atrial pressure of 3 mmHg. IAS/Shunts: The atrial septum is grossly normal.  LEFT VENTRICLE PLAX 2D LVIDd:         3.90 cm  Diastology LVIDs:         3.00 cm  LV e' medial:    10.20 cm/s LV PW:         1.10 cm  LV E/e' medial:  9.2 LV IVS:        1.10 cm  LV e' lateral:   5.77 cm/s LVOT diam:     2.30 cm  LV E/e' lateral: 16.3 LV SV:         64 LV SV Index:   29 LVOT Area:     4.15 cm  RIGHT VENTRICLE             IVC RV Basal diam:  2.30 cm     IVC diam: 2.00 cm RV S prime:     14.30 cm/s TAPSE (M-mode): 1.4 cm LEFT ATRIUM             Index       RIGHT ATRIUM           Index LA diam:        3.70 cm 1.66 cm/m  RA Area:     10.50 cm LA Vol (A2C):   43.1 ml 19.38 ml/m RA Volume:   19.70 ml  8.86 ml/m LA Vol (A4C):   47.6 ml 21.41 ml/m LA Biplane Vol: 47.1 ml 21.18 ml/m  AORTIC VALVE LVOT Vmax:   122.50 cm/s LVOT Vmean:  78.400 cm/s LVOT VTI:    0.155 m  AORTA Ao Root diam: 3.30 cm Ao Asc diam:  3.00 cm MITRAL VALVE MV Area (PHT): 4.49 cm     SHUNTS MV Decel Time: 169 msec     Systemic VTI:  0.16 m MV E velocity: 94.30 cm/s   Systemic Diam: 2.30 cm MV A velocity: 102.00 cm/s MV E/A ratio:  0.92 Rudean Haskell MD Electronically signed by Rudean Haskell MD Signature Date/Time: 12/29/2020/6:18:56 PM    Final    Disposition   Pt is being discharged home today in good condition.  Follow-up Plans & Appointments     Follow-up [facility] Follow up.   Why: The office will call patient but keep  appointment on 8/5. Contact information: [place] [address] [phone]         Josue Hector, MD .   Specialty: Cardiology Contact information: [id number] [place] [address] [phone]         [place] [facility] Follow up.   Specialty: Cardiology Why: arrange limited echo in 2 weeks Contact information: [address] [place] [phone]               Discharge Instructions     Diet -  low sodium heart healthy   Complete by: As directed    Discharge instructions   Complete by: As directed    Obtain a CMP at PCP office next week. Please follow up with your PCP about your thyroid studies this admission.   Our office will call you with the time of your echo at [place]. If you have not heard in 2 days, please call our [facility] office/Dr. Nishan's office.   Please stop taking ibuprofen when your chest pain has resolved. Please stop lipitor/atorvastatin while taking colchicine.  Please remain out of work until we have seen you in clinic on 01/26/21 - I have provided a letter.   Increase activity slowly   Complete by: As directed        Discharge Medications   Allergies as of 01/03/2021   No Known Allergies      Medication List     STOP taking these medications    levofloxacin 500 MG tablet Commonly known as: LEVAQUIN       TAKE these medications    aspirin 81 MG tablet Take 81 mg by mouth daily.   atorvastatin 20 MG tablet Commonly known as: LIPITOR Take 1 tablet (20 mg total) by mouth at bedtime. Do not take while taking colchicine. Resume when colchicine treatment is finished. What changed: additional instructions   clopidogrel 75 MG tablet Commonly known as: PLAVIX Take 75 mg by mouth daily with breakfast.   guaiFENesin 100 MG/5ML Soln Commonly known as: ROBITUSSIN Take 5 mLs (100 mg total) by mouth every 4 (four) hours as needed for  cough or to loosen phlegm.   ibuprofen 800 MG tablet Commonly known as: ADVIL Take 1 tablet (800 mg total) by mouth 3 (three) times daily. Stop taking as soon as you are pain free.   isosorbide mononitrate 60 MG 24 hr tablet Commonly known as: Imdur Take 1 tablet (60 mg total) by mouth every morning.   lisinopril-hydrochlorothiazide 20-12.5 MG tablet Commonly known as: ZESTORETIC Take 1 tablet by mouth daily.   metFORMIN 500 MG tablet Commonly known as: GLUCOPHAGE Take 500 mg by mouth at bedtime.   metoprolol tartrate 25 MG tablet Commonly known as: LOPRESSOR Take 25 mg by mouth 2 (two) times daily.   Mitigare 0.6 MG Caps Generic drug: Colchicine Take 1 capsule (0.6 mg total) by mouth daily. Start taking on: January 04, 2021   nitroGLYCERIN 0.4 MG SL tablet Commonly known as: NITROSTAT Place 1 tablet (0.4 mg total) under the tongue every 5 (five) minutes as needed for chest pain.   pantoprazole 20 MG tablet Commonly known as: PROTONIX Take 20 mg by mouth 2 (two) times daily.   predniSONE 10 MG tablet Commonly known as: DELTASONE Take 40 mg (4 tablets) for 14 days, reduce to 30 mg (3 tablets) for 14 days, then reduce to 20 mg (2 tablets) for 14 days, then 10 mg (1 tablet) for 14 days, then stop.   Vitamin D (Ergocalciferol) 1.25 MG (50000 UNIT) Caps capsule Commonly known as: DRISDOL Take 50,000 Units by mouth once a week.           Outstanding Labs/Studies   Limited echo  Duration of Discharge Encounter   Greater than 30 minutes including physician time.  Signed, [place], [place] 01/03/2021, 10:57 AM

## 2021-01-03 NOTE — Progress Notes (Addendum)
Progress Note  Patient Name: Alan Copeland Date of Encounter: 01/03/2021  [facility] Cardiologist: Jenkins Rouge, MD   Subjective   Patient feels well, ambulated in the room and to the bathroom. Anxious to go home.   Inpatient Medications    Scheduled Meds:  aspirin  81 mg Oral Daily   atorvastatin  20 mg Oral QHS   azithromycin  500 mg Oral Daily   clopidogrel  75 mg Oral Q breakfast   colchicine  0.6 mg Oral Daily   enoxaparin (LOVENOX) injection  40 mg Subcutaneous QHS   ibuprofen  800 mg Oral TID   isosorbide mononitrate  60 mg Oral BH-q7a   metoprolol tartrate  25 mg Oral BID   pantoprazole  20 mg Oral BID   predniSONE  40 mg Oral Daily   Continuous Infusions:  sodium chloride 250 mL (12/30/20 1453)   cefTRIAXone (ROCEPHIN)  IV 1 g (01/02/21 0957)   PRN Meds: sodium chloride, acetaminophen, ALPRAZolam, guaiFENesin, morphine injection, nitroGLYCERIN, ondansetron (ZOFRAN) IV, zolpidem   Vital Signs    Vitals:   01/02/21 0430 01/02/21 1108 01/02/21 2024 01/03/21 0256  BP: (!) 144/70 117/68 136/73 132/81  Pulse: 70 75 72 (!) 59  Resp: 16 17 17 16   Temp: 98 F (36.7 C) 98.2 F (36.8 C) 98.3 F (36.8 C) 98.3 F (36.8 C)  TempSrc: Oral Oral Oral Oral  SpO2: 97% 98% 99% 100%  Weight: 91.6 kg   91.9 kg  Height:        Intake/Output Summary (Last 24 hours) at 01/03/2021 0752 Last data filed at 01/03/2021 0300 Gross per 24 hour  Intake 1320 ml  Output 553 ml  Net 767 ml   Last 3 Weights 01/03/2021 01/02/2021 01/01/2021  Weight (lbs) 202 lb 9.6 oz 202 lb 202 lb 3.2 oz  Weight (kg) 91.899 kg 91.627 kg 91.717 kg      Telemetry    Sinus to sinus tachycardia with movement/activity - HR 60--> 120s - Personally Reviewed  ECG    No new tracings - Personally Reviewed  Physical Exam   GEN: No acute distress.   Neck: No JVD Cardiac: RRR, no murmurs, rubs, or gallops.  Respiratory: clear in upper lobes, rhonchi in right lower lung field - sounds  improved from yesterday GI: Soft, nontender, non-distended  MS: No edema; No deformity. Neuro:  Nonfocal  Psych: Normal affect   Labs    High Sensitivity Troponin:   Recent Labs  Lab 12/29/20 1045 12/29/20 1632  TROPONINIHS 10 14      Chemistry Recent Labs  Lab 12/29/20 1045 12/29/20 2143 12/30/20 0515 01/02/21 1038  NA 126*  --  129* 133*  K 5.1  --  4.3 3.4*  CL 94*  --  94* 99  CO2 20*  --  22 25  GLUCOSE 149*  --  96 106*  BUN 10  --  11 9  CREATININE 1.13 1.10 1.10 0.96  CALCIUM 9.0  --  9.0 8.2*  PROT 8.0  --  7.1 6.9  ALBUMIN 2.8*  --  2.5* 2.1*  AST 47*  --  28 102*  ALT 69*  --  50* 107*  ALKPHOS 110  --  92 97  BILITOT 0.6  --  0.7 0.5  GFRNONAA >60 >60 >60 >60  ANIONGAP 12  --  13 9     Hematology Recent Labs  Lab 12/29/20 1045 12/29/20 2143 01/03/21 0338  WBC 17.5* 21.1* 5.1  RBC 6.56*  6.22* 5.30  HGB 14.1 13.5 11.3*  HCT 44.1 42.4 35.4*  MCV 67.2* 68.2* 66.8*  MCH 21.5* 21.7* 21.3*  MCHC 32.0 31.8 31.9  RDW 17.6* 17.2* 16.0*  PLT 804* 631* 553*    BNPNo results for input(s): BNP, PROBNP in the last 168 hours.   DDimer No results for input(s): DDIMER in the last 168 hours.   Radiology    No results found.  Cardiac Studies   Echo 12/29/20:  1. Left ventricular ejection fraction, by estimation, is 60 to 65%. The  left ventricle has normal function. The left ventricle has no regional  wall motion abnormalities. There is mild concentric left ventricular  hypertrophy. Left ventricular diastolic  parameters are indeterminate.   2. Right ventricular systolic function is normal. The right ventricular  size is normal. Tricuspid regurgitation signal is inadequate for assessing  PA pressure.   3. The mitral valve is normal in structure. No evidence of mitral valve  regurgitation.   4. The aortic valve is tricuspid. Aortic valve regurgitation is not  visualized. No aortic stenosis is present.   5. The inferior vena cava is normal in size  with greater than 50%  respiratory variability, suggesting right atrial pressure of 3 mmHg.   6. Moderate pericardial effusion. There is not evidence of increased  pericardial pressure. There is tissue Doppler annulus Reversus and an  intraventricular septal bounce, these are some, but not all of the factors  seen in constriction.   Patient Profile     55 y.o. male with a history of CAD s/p PCI/DES to RCA in 2013 with ISR in 2015 treated with DES to RCA, last cath in 2018 with patent stents, coronary artery spasm, HLD, HTN, and DM2 who was admitted 12/29/20 for increased pericardial effusion and chest pain.   He was hospitalized at [facility] regional 12/20/20 with sepsis and PNA with pericardial effusion. Elevated ESR and CRP treated with prednisone. He was discharged but continued to have CP and dyspnea prompting re-evaluation at [facility]. Echo in ED reviewed and showed moderate pericardial effusion but no signs of tamponade.   Assessment & Plan    Pericarditis Pericardial effusion Treated with prednisone but no colchicine during recent hospitalization and was not discharged on a taper. He was started on colchicine and ibuprofen this admission along with ABX for PNA. On optimal therapy, he continued to have chest pain and was restarted on prednisone. Yesterday, he described right lateral lower rib regions discomfort, not classical pericarditis pain. Suspect this is more related to his PNA. No further IV morphine.    CRP is trending down 27.9 --> 11 --> 7.2 Sed rate is trending down 21 --> 80 --> 73 WBC resolved 21.1 --> 5.0  Suspect he will need several weeks of ibuprofen, 3-6 months of colchicine, and a very long prednisone taper (starting at 40 mg/day, taper 5-10 mg/day every 2 weeks as long as asymptomatic and inflammatory markers remain low. Consider repeat limited echo in 2 weeks to monitor moderate pericardial effusion.    CAP Continue zithromax and IV rocephine. Length of therapy x 5 days per  pharmD. Continue IS at home and ambulation. Follow with PCP next week.   Elevated TSH TSH 4.7-5.7. Elevated free T4 to 1.61. Will defer to PCP.    CAD  HTN HLD with LDL goal < 70 DES to RCA in 2013 with ISR in 2015 treated with DES. Continue imdur and metoprolol.    Elevated LFTs AST 28 --> 102 ALT 50 -->  107 I do not see a medication reason for elevated LFTs. Colchicine has reports of hepatotoxicity, but no strong link. He denies IVDU, no alcohol. Consider adding on a hepatitis panel.    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 I have advised him to stay out of work until he is seen in follow up. I will provide a letter.    For questions or updates, please contact [place] Please consult [email or web address] for contact info under        Signed, Ledora Bottcher, PA  01/03/2021, 7:52 AM    I have seen and examined the patient along with Ledora Bottcher, PA .  I have reviewed the chart, notes and new data.  I agree with PA/NP's note.  Key new complaints: feels much better. No pain with deep breath. No dyspnea Key examination changes: no rub, RRR, clear lungs Key new findings / data: mild elevation in transaminases 2-3 x ULN  PLAN: DC home today. Wean off NSAIDs quickly - if no pleuritic discomfort, stop in 1-2 days. Wean prednisone by 10 mg every 2 weeks. Should be off completely in 8 weeks. Continue colchicine for 3-6 months.  Stop statin while on colchicine. The elevation in LFTs may be due to colchicine-statin interaction and will be a problem with all statins. F/u echo and clinical f/u in about 2 weeks. Recheck LFTs at that time.  Sanda Klein, MD, [place] [phone] 01/03/2021, 11:00 AM

## 2021-01-04 LAB — CULTURE, BLOOD (ROUTINE X 2)
Culture: NO GROWTH
Culture: NO GROWTH

## 2021-01-12 ENCOUNTER — Ambulatory Visit (HOSPITAL_BASED_OUTPATIENT_CLINIC_OR_DEPARTMENT_OTHER)
Admission: RE | Admit: 2021-01-12 | Discharge: 2021-01-12 | Disposition: A | Discharge status: Home / Self Care | Payer: 59 | Source: Ambulatory Visit | Attending: Cardiovascular Disease | Admitting: Cardiovascular Disease

## 2021-01-12 ENCOUNTER — Other Ambulatory Visit: Payer: Self-pay

## 2021-01-12 DIAGNOSIS — I3139 Other pericardial effusion (noninflammatory): Secondary | ICD-10-CM

## 2021-01-12 DIAGNOSIS — I313 Pericardial effusion (noninflammatory): Secondary | ICD-10-CM | POA: Diagnosis not present

## 2021-01-12 LAB — ECHOCARDIOGRAM LIMITED: Area-P 1/2: 2.91 cm2

## 2021-01-12 NOTE — Progress Notes (Signed)
*  PRELIMINARY RESULTS* Echocardiogram 2D Echocardiogram limited has been performed.  Neomia Dear RDCS 01/12/2021, 8:31 AM

## 2021-01-26 ENCOUNTER — Encounter (HOSPITAL_BASED_OUTPATIENT_CLINIC_OR_DEPARTMENT_OTHER): Payer: Self-pay | Admitting: Family

## 2021-01-26 ENCOUNTER — Ambulatory Visit (INDEPENDENT_AMBULATORY_CARE_PROVIDER_SITE_OTHER): Payer: 59 | Admitting: Family

## 2021-01-26 ENCOUNTER — Other Ambulatory Visit: Payer: Self-pay

## 2021-01-26 VITALS — BP 134/76 | HR 91 | Ht 74.0 in | Wt 207.2 lb

## 2021-01-26 DIAGNOSIS — I313 Pericardial effusion (noninflammatory): Secondary | ICD-10-CM | POA: Diagnosis not present

## 2021-01-26 DIAGNOSIS — E785 Hyperlipidemia, unspecified: Secondary | ICD-10-CM

## 2021-01-26 DIAGNOSIS — I319 Disease of pericardium, unspecified: Secondary | ICD-10-CM | POA: Diagnosis not present

## 2021-01-26 DIAGNOSIS — I3139 Other pericardial effusion (noninflammatory): Secondary | ICD-10-CM

## 2021-01-26 DIAGNOSIS — I25118 Atherosclerotic heart disease of native coronary artery with other forms of angina pectoris: Secondary | ICD-10-CM

## 2021-01-26 MED ORDER — COLCHICINE 0.6 MG PO CAPS: 0.6000 mg | ORAL_CAPSULE | Freq: Every day | ORAL | 0 refills | Status: DC

## 2021-01-26 NOTE — Progress Notes (Signed)
Office Visit    Patient Name: Alan Copeland Date of Encounter: 01/26/2021  PCP:  [facility]   [facility]  Cardiologist:  [name], MD  Advanced Practice Provider:  No care team member to display Electrophysiologist:  None   Chief Complaint    Alan Copeland is a 55 y.o. male with a hx of pericardial effusion, hypertension, GERD, coronary artery disease, hyperlipidemia, recurrent idiopathic pericarditis, transaminitis presents today for hospital follow-up  Past Medical History    Past Medical History:  Diagnosis Date   Anginal pain (HCC)    CAD (coronary artery disease) 01/25/2012   a. RCA ptca/des 2013 with NSTEMI b. restenosis of RCA in 07/2013 w/ DES placed c. cath 06/17/2014 negative cath, patent stent in RCA with minimal restenosis  d. LHC 11/28/16- no change   Carotid bruit    Coronary artery spasm (HCC) 11/28/2016   See on cath 11/28/16   High cholesterol    Hypertension    Sciatica    Sinus headache    "weekly" (07/24/2013)   Type II diabetes mellitus (HCC) dx'd 2013   Past Surgical History:  Procedure Laterality Date   CARDIAC CATHETERIZATION  07/24/2013   CARDIAC CATHETERIZATION N/A 06/17/2015   Procedure: Left Heart Cath and Coronary Angiography;  Surgeon: Kathleene Hazel, MD;  Location[facility] INVASIVE CV LAB;  Service: Cardiovascular;  Laterality: N/A;   CORONARY ANGIOPLASTY WITH STENT PLACEMENT  01/2012   "1"   INGUINAL HERNIA REPAIR  ~ 1985   left   LEFT HEART CATH AND CORONARY ANGIOGRAPHY N/A 11/28/2016   Procedure: Left Heart Cath and Coronary Angiography;  Surgeon: Kathleene Hazel, MD;  Location: [facility] INVASIVE CV LAB;  Service: Cardiovascular;  Laterality: N/A;   LEFT HEART CATHETERIZATION WITH CORONARY ANGIOGRAM N/A 01/26/2012   Procedure: LEFT HEART CATHETERIZATION WITH CORONARY ANGIOGRAM;  Surgeon: Lesleigh Noe, MD;  Location: [facility] CATH LAB;  Service: Cardiovascular;   Laterality: N/A;   LEFT HEART CATHETERIZATION WITH CORONARY ANGIOGRAM N/A 07/24/2013   Procedure: LEFT HEART CATHETERIZATION WITH CORONARY ANGIOGRAM;  Surgeon: Runell Gess, MD;  Location: [facility] CATH LAB;  Service: Cardiovascular;  Laterality: N/A;   PERCUTANEOUS CORONARY STENT INTERVENTION (PCI-S) N/A 07/27/2013   Procedure: PERCUTANEOUS CORONARY STENT INTERVENTION (PCI-S);  Surgeon: Micheline Chapman, MD;  Location: [facility] CATH LAB;  Service: Cardiovascular;  Laterality: N/A;    Allergies  No Known Allergies  History of Present Illness    Alan Copeland is a 55 y.o. male with a hx of pericardial effusion, hypertension, GERD, coronary artery disease, hyperlipidemia, recurrent idiopathic pericarditis, transaminitis last seen while hospitalized.  CAD with a history of PCI/DES to the RCA in 2013 with ISR in 2015 treated with DES to the RCA.  Most recent cardiac catheterization in 2018 with patent stents.  Hospitalized 12/20/2020 at [facility] regional with sepsis from pneumonia.  Noted to have pericardial effusion  He was started on 5 days of prednisone and treated with IV ceftriaxone and azithromycin.  Noted left pleural effusion, left lower lobe and lingula collapse, right lung atelectasis.  Continue to experience chest discomfort after hospital discharge.  Readmitted to [facility] 12/29/2020.  Cardiology was consulted due to concern for tamponade and pericardial effusion noted on CT chest.  Dr. Duke Salvia reviewed echocardiogram with no evidence of tamponade.  He does have respiratory inflow variation but IVC not dilated RV not collapsing.  He received prednisone during previous admission at  [place] but no colchicine which does raise the risk of worsening and recurrent pericarditis.  He was started on colchicine.  CRP and sed rate both trended down during admission.  His atorvastatin was held in the setting of long-term usage of colchicine at discharge.  Elevated LFTs also noted.  Repeat  Echocardiogram 8 /22 LVEF 60 to 65%, no R WMA's, RV normal size and function, trivial circumferential pericardial effusion, right atrial pressure 3 mmHg.  He presents today for follow-up.  Reports no recurrent chest pain, pressure, tightness.  He has had mild back pain which is overall not bothersome.  Tells me his breathing is much improved.  He had congested cough initially at discharge but this has since resolved.  Monitoring his blood pressure at home with readings 111/66.  Denies lightheadedness, dizziness.  He is interested in returning to work. 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 He has worked in the same job for 17 years and very much enjoys his work..    EKGs/Labs/Other Studies Reviewed:   The following studies were reviewed today:  Echo 12/29/20:  1. Left ventricular ejection fraction, by estimation, is 60 to 65%. The  left ventricle has normal function. The left ventricle has no regional  wall motion abnormalities. There is mild concentric left ventricular  hypertrophy. Left ventricular diastolic  parameters are indeterminate.   2. Right ventricular systolic function is normal. The right ventricular  size is normal. Tricuspid regurgitation signal is inadequate for assessing  PA pressure.   3. The mitral valve is normal in structure. No evidence of mitral valve  regurgitation.   4. The aortic valve is tricuspid. Aortic valve regurgitation is not  visualized. No aortic stenosis is present.   5. The inferior vena cava is normal in size with greater than 50%  respiratory variability, suggesting right atrial pressure of 3 mmHg.   6. Moderate pericardial effusion. There is not evidence of increased  pericardial pressure. There is tissue Doppler annulus Reversus and an  intraventricular septal bounce, these are some, but not all of the factors  seen in constriction. _____________    EKG:  No EKG today.   Recent Labs: 12/29/2020:  Magnesium 2.1; TSH 4.724 01/02/2021: ALT 107; BUN 9; Creatinine, Ser 0.96; Potassium 3.4; Sodium 133 01/03/2021: Hemoglobin 11.3; Platelets 553  Recent Lipid Panel    Component Value Date/Time   CHOL 156 01/31/2018 0910   TRIG 49 01/31/2018 0910   HDL 57 01/31/2018 0910   CHOLHDL 2.7 01/31/2018 0910   CHOLHDL 3.2 11/28/2016 0310   VLDL 9 11/28/2016 0310   LDLCALC 89 01/31/2018 0910    Home Medications   Current Meds  Medication Sig   aspirin 81 MG tablet Take 81 mg by mouth daily.   atorvastatin (LIPITOR) 20 MG tablet Take 1 tablet (20 mg total) by mouth at bedtime. Do not take while taking colchicine. Resume when colchicine treatment is finished.   clopidogrel (PLAVIX) 75 MG tablet Take 75 mg by mouth daily with breakfast.   Colchicine 0.6 MG CAPS Take 1 capsule (0.6 mg total) by mouth daily.   guaiFENesin (ROBITUSSIN) 100 MG/5ML SOLN Take 5 mLs (100 mg total) by mouth every 4 (four) hours as needed for cough or to loosen phlegm.   ibuprofen (ADVIL) 800 MG tablet Take 1 tablet (800 mg total) by mouth 3 (three) times daily. Stop taking as soon as you are pain free.   lisinopril-hydrochlorothiazide (PRINZIDE,ZESTORETIC) 20-12.5 MG  tablet Take 1 tablet by mouth daily.   metFORMIN (GLUCOPHAGE) 500 MG tablet Take 500 mg by mouth at bedtime.   metoprolol tartrate (LOPRESSOR) 25 MG tablet Take 25 mg by mouth 2 (two) times daily.   nitroGLYCERIN (NITROSTAT) 0.4 MG SL tablet Place 1 tablet (0.4 mg total) under the tongue every 5 (five) minutes as needed for chest pain.   pantoprazole (PROTONIX) 20 MG tablet Take 20 mg by mouth 2 (two) times daily.   predniSONE (DELTASONE) 10 MG tablet Take 40 mg (4 tablets) for 14 days, reduce to 30 mg (3 tablets) for 14 days, then reduce to 20 mg (2 tablets) for 14 days, then 10 mg (1 tablet) for 14 days, then stop.     Review of Systems      All other systems reviewed and are otherwise negative except as noted above.  Physical Exam    VS:  BP 134/76    Pulse 91   Ht 6\' 2"  (1.88 m)   Wt 207 lb 3.2 oz (94 kg)   SpO2 98%   BMI 26.60 kg/m  , BMI Body mass index is 26.6 kg/m.  Wt Readings from Last 3 Encounters:  01/26/21 207 lb 3.2 oz (94 kg)  01/03/21 202 lb 9.6 oz (91.9 kg)  02/05/18 211 lb (95.7 kg)     GEN: Well nourished, well developed, in no acute distress. HEENT: normal. Neck: Supple, no JVD, carotid bruits, or masses. Cardiac: RRR, no murmurs, rubs, or gallops. No clubbing, cyanosis, edema.  Radials/PT 2+ and equal bilaterally.  Respiratory:  Respirations regular and unlabored, clear to auscultation bilaterally. GI: Soft, nontender, nondistended. MS: No deformity or atrophy. Skin: Warm and dry, no rash. Neuro:  Strength and sensation are intact. Psych: Normal affect.  Assessment & Plan    Pericardial effusion - Resolved by echo 01/12/21.   Recurrent idiopathic pericarditis - Recent admission 12/2020. He is on 6 month course of colchicine 0.6mg  BID which we will continue. Presently on long prednisone taper of 56 days. No recurrent chest pain since discharge. Discussed importance of completing medical therapy. Given chest pain has resolved and pericardial effusion resolved, may return to work.  CAD - Stable with no anginal symptoms. No indication for ischemic evaluation.  Cath 2018 with patent stents. GDMT includes aspirin, imdur, metoprolol. Atorvastatin on hold due to transaminitis and colchicine use.   HTN - BP well controlled. Continue current antihypertensive regimen.    HLD, LDL goal <70 -atorvastatin currently on hold due to 6 month course of colchicine  DM2 - Continue to follow with PCP.   Transaminitis -12/30/20 AST 28, ALT 50. 01/02/21 AST 102, ALT 107. 01/13/21 AST 16, ALT 31. Resolved since hospital discharge.  Elevated TSH- During recent admission 4.724. Continue to follow with PCP.   Iron deficiency anemia - Continue to follow with PCP.   Disposition: Follow up in 3 month(s) with Dr. 03/15/21 or  APP.  Signed, Eden Emms, NP 01/26/2021, 3:09 PM [facility]

## 2021-01-26 NOTE — Patient Instructions (Addendum)
Medication Instructions:  Continue your current medications.   It is important to keep taking your colchicine for six months after your recent admission. This helps reduce inflammation around the heart and prevent it from happening again.   Your prednisone also helps to reduce inflammation. It can cause your blood sugar to be elevated so simply monitor your blood sugar carefully and talk with your primary care provider if your numbers are consistently elevated.   Wait to resume your Atorvastatin until you are done with you Colchicine.   *If you need a refill on your cardiac medications before your next appointment, please call your pharmacy*  Lab Work: None ordered today  Testing/Procedures: Your recent echocardiogram showed your pericardial effusion (fluid around your heart) has resolved.  Follow-Up: At [facility], you and your health needs are our priority.  As part of our continuing mission to provide you with exceptional heart care, we have created designated Provider Care Teams.  These Care Teams include your primary Cardiologist (physician) and Advanced Practice Providers (APPs -  Physician Assistants and Nurse Practitioners) who all work together to provide you with the care you need, when you need it.  We recommend signing up for the patient portal called "MyChart".  Sign up information is provided on this After Visit Summary.  MyChart is used to connect with patients for Virtual Visits (Telemedicine).  Patients are able to view lab/test results, encounter notes, upcoming appointments, etc.  Non-urgent messages can be sent to your provider as well.   To learn more about what you can do with MyChart, go to [email or web address].    Your next appointment:   3 month(s)  The format for your next appointment:   In Person  Provider:   You may see [name], MD or one of the following Advanced Practice Providers on your designated Care Team:   Nada Boozer, NP Alver Sorrow, NP   Other Instructions  Heart Healthy Diet Recommendations: A low-salt diet is recommended. Meats should be grilled, baked, or boiled. Avoid fried foods. Focus on lean protein sources like fish or chicken with vegetables and fruits. The American Heart Association is a Chief Technology Officer!   Exercise recommendations: The American Heart Association recommends 150 minutes of moderate intensity exercise weekly. Try 30 minutes of moderate intensity exercise 4-5 times per week. This could include walking, jogging, or swimming.   Pericarditis  Pericarditis is inflammation of the pericardium. The pericardium is a thin, double-layered, fluid-filled sac that surrounds your heart. The pericardiumprotects and holds your heart in your chest cavity. Inflammation of your pericardium can cause rubbing (friction) between the two layers when your heart beats. Fluid may also build up between the layers of the sac (pericardial effusion). There are three types of this condition: Acute pericarditis. Inflammation develops suddenly and causes pericardial effusion. Chronic pericarditis. Inflammation may develop slowly, or it may continue after acute pericarditis and last longer than 6 months. Constrictive pericarditis (rare). The layers of the pericardium stiffen and develop scar tissue. The scar tissue thickens and sticks together. This makes it difficult for the heart to work in a normal way. In most cases, pericarditis is acute and not serious. Chronic pericarditis andconstrictive pericarditis are more serious and may require treatment. What are the causes? In most cases, the cause of this condition is not known. If a cause is found, it may be: An infection from a virus. A heart attack (myocardial infarction). Problems after open-heart surgery. Chest injury. Autoimmune disorder. The body's defense system (  immune system) attacks healthy tissues. Examples include lupus or rheumatoid arthritis. Kidney  failure. Underactive thyroid gland (hypothyroidism). Cancer that has spread (metastasized) to the pericardium from another part of the body. Treatment using high-energy X-rays (radiation). Certain medicines, including some seizure medicines, blood thinners, heart medicines, and antibiotics. Infection from a fungus or bacteria (rare). What increases the risk? The following factors may make you more likely to develop this condition: Being male. Being 34-68 years old. Having had pericarditis before. Having had a recent upper respiratory tract infection. What are the signs or symptoms? The main symptom of this condition is chest pain. The chest pain may: Be in the center or the left side of your chest. Not go away with rest. Last for many hours or days. Worsen when you lie down and get better when you sit up and lean forward. Worsen when you swallow. Move to your back, neck, or shoulder. Other symptoms may include: A chronic, dry cough. Heart palpitations. These may feel like rapid, fluttering, or pounding heartbeats. Dizziness or fainting. Tiredness or fatigue. Fever. Rapid breathing. Shortness of breath when lying down. How is this diagnosed? This condition is diagnosed based on medical history, physical exam, and diagnostic tests. During your physical exam, your health care provider will listen for friction while your heart beats (pericardial rub). You may also have tests, including: Blood tests to look for signs of infection and inflammation. Electrocardiogram (ECG). This test measures the electrical activity in your heart. Echocardiogram. This uses sound waves to make images of your heart. CT scan. MRI. Culture of pericardial fluid. Removing a tissue sample of the pericardium to look at under a microscope (biopsy). If tests show that you may have constrictive pericarditis, a small, thin tube may be inserted into your heart to confirm the diagnosis (cardiac catheterization). How  is this treated? Treatment for this condition depends on the cause and type of pericarditis that you have. In most cases, acute pericarditis will clear up on its own. Treatment may include: Limiting physical activity. Medicines, such as: NSAIDs to relieve pain and inflammation. Steroids to reduce inflammation. Colchicine to relieve pain and inflammation. A procedure to remove fluid using a needle (pericardiocentesis) if fluid buildup puts pressure on the heart. Surgery to remove part of the pericardium if constrictive pericarditis develops. If another condition is causing your pericarditis, you may also need treatmentfor that condition. Follow these instructions at home: Limit your activity as told by your health care provider until your symptoms improve. This usually includes: Resting or sitting for most of the day. No long walks. No exercise. No sports. Athletes may need to limit competition for several months. Do not use any products that contain nicotine or tobacco, such as cigarettes and e-cigarettes. If you need help quitting, ask your health care provider. Eat a heart-healthy diet. Ask your dietitian what foods are healthy for your heart. Take over-the-counter and prescription medicines only as told by your health care provider. If you were prescribed an antibiotic medicine, take it as told by your health care provider. Do not stop taking the antibiotic even if you start to feel better. Keep all follow-up visits as told by your health care provider. This is important. Contact a health care provider if: You continue to have symptoms of pericarditis. You develop new symptoms of pericarditis. Your symptoms get worse. You have a fever. Get help right away if: You have worsening chest pain.  You have difficulty breathing. You pass out. These symptoms may represent a serious  problem that is an emergency. Do not wait to see if the symptoms will go away. Get medical help right away.  Call your local emergency services (911 in the [place] Do not drive yourself to the hospital. Summary Pericarditis is inflammation of the pericardium. The pericardium is a thin, double-layered, fluid-filled sac that surrounds your heart. The main symptom of this condition is chest pain. Treatment for this condition depends on the cause and type of pericarditis that you have. This information is not intended to replace advice given to you by your health care provider. Make sure you discuss any questions you have with your healthcare provider. Document Revised: 07/04/2017 Document Reviewed: 07/04/2017 Elsevier Patient Education  2022 [employer].

## 2021-01-27 ENCOUNTER — Encounter (HOSPITAL_BASED_OUTPATIENT_CLINIC_OR_DEPARTMENT_OTHER): Payer: Self-pay | Admitting: Family

## 2021-02-06 ENCOUNTER — Other Ambulatory Visit (HOSPITAL_COMMUNITY): Payer: Self-pay

## 2021-02-23 ENCOUNTER — Other Ambulatory Visit (HOSPITAL_BASED_OUTPATIENT_CLINIC_OR_DEPARTMENT_OTHER): Payer: Self-pay | Admitting: Family

## 2021-02-23 DIAGNOSIS — I3139 Other pericardial effusion (noninflammatory): Secondary | ICD-10-CM

## 2021-02-23 DIAGNOSIS — I313 Pericardial effusion (noninflammatory): Secondary | ICD-10-CM

## 2021-02-23 DIAGNOSIS — I25118 Atherosclerotic heart disease of native coronary artery with other forms of angina pectoris: Secondary | ICD-10-CM

## 2021-02-23 DIAGNOSIS — I319 Disease of pericardium, unspecified: Secondary | ICD-10-CM

## 2021-05-01 ENCOUNTER — Ambulatory Visit (HOSPITAL_BASED_OUTPATIENT_CLINIC_OR_DEPARTMENT_OTHER): Payer: 59 | Admitting: Family

## 2021-06-15 ENCOUNTER — Encounter (HOSPITAL_BASED_OUTPATIENT_CLINIC_OR_DEPARTMENT_OTHER): Payer: Self-pay | Admitting: Family

## 2021-06-15 ENCOUNTER — Ambulatory Visit (INDEPENDENT_AMBULATORY_CARE_PROVIDER_SITE_OTHER): Payer: BLUE CROSS/BLUE SHIELD | Admitting: Family

## 2021-06-15 ENCOUNTER — Other Ambulatory Visit: Payer: Self-pay

## 2021-06-15 VITALS — BP 140/72 | HR 65 | Ht 74.0 in | Wt 221.6 lb

## 2021-06-15 DIAGNOSIS — Z8679 Personal history of other diseases of the circulatory system: Secondary | ICD-10-CM

## 2021-06-15 DIAGNOSIS — I10 Essential (primary) hypertension: Secondary | ICD-10-CM

## 2021-06-15 DIAGNOSIS — I25118 Atherosclerotic heart disease of native coronary artery with other forms of angina pectoris: Secondary | ICD-10-CM | POA: Diagnosis not present

## 2021-06-15 DIAGNOSIS — E785 Hyperlipidemia, unspecified: Secondary | ICD-10-CM | POA: Diagnosis not present

## 2021-06-15 MED ORDER — COLCHICINE 0.6 MG PO CAPS: 1.0000 | ORAL_CAPSULE | Freq: Every day | ORAL | 0 refills | Status: AC

## 2021-06-15 NOTE — Addendum Note (Signed)
Addended by: Makahla Kiser S on: 06/15/2021 05:07 PM ° ° Modules accepted: Orders ° °

## 2021-06-15 NOTE — Progress Notes (Signed)
Office Visit    Patient Name: Alan Copeland Date of Encounter: 06/15/2021  PCP:  O'Connor, Lauren [place], [place]  Cardiologist:  Jenkins Rouge, MD  Advanced Practice Provider:  No care team member to display Electrophysiologist:  None   Chief Complaint    Alan Copeland is a 57 y.o. male with a hx of pericardial effusion, hypertension, GERD, coronary artery disease, hyperlipidemia, recurrent idiopathic pericarditis, transaminitis presents today for pericarditis follow-up  Past Medical History    Past Medical History:  Diagnosis Date   Anginal pain ([place])    CAD (coronary artery disease) 01/25/2012   a. RCA ptca/des 2013 with NSTEMI b. restenosis of RCA in 07/2013 w/ DES placed c. cath 06/17/2014 negative cath, patent stent in RCA with minimal restenosis  d. LHC 11/28/16- no change   Carotid bruit    Coronary artery spasm ([place]) 11/28/2016   See on cath 11/28/16   High cholesterol    Hypertension    Sciatica    Sinus headache    "weekly" (07/24/2013)   Type II diabetes mellitus ([place]) dx'd 2013   Past Surgical History:  Procedure Laterality Date   CARDIAC CATHETERIZATION  07/24/2013   CARDIAC CATHETERIZATION N/A 06/17/2015   Procedure: Left Heart Cath and Coronary Angiography;  Surgeon: Burnell Blanks, MD;  Location: [place] [facility];  Service: Cardiovascular;  Laterality: N/A;   CORONARY ANGIOPLASTY WITH STENT PLACEMENT  01/2012   "1"   INGUINAL HERNIA REPAIR  ~ 1985   left   LEFT HEART CATH AND CORONARY ANGIOGRAPHY N/A 11/28/2016   Procedure: Left Heart Cath and Coronary Angiography;  Surgeon: Burnell Blanks, MD;  Location: [place] [facility];  Service: Cardiovascular;  Laterality: N/A;   LEFT HEART CATHETERIZATION WITH CORONARY ANGIOGRAM N/A 01/26/2012   Procedure: LEFT HEART CATHETERIZATION WITH CORONARY ANGIOGRAM;  Surgeon: Sinclair Grooms, MD;  Location: [facility] CATH LAB;  Service: Cardiovascular;  Laterality: N/A;    LEFT HEART CATHETERIZATION WITH CORONARY ANGIOGRAM N/A 07/24/2013   Procedure: LEFT HEART CATHETERIZATION WITH CORONARY ANGIOGRAM;  Surgeon: Lorretta Harp, MD;  Location: [facility] CATH LAB;  Service: Cardiovascular;  Laterality: N/A;   PERCUTANEOUS CORONARY STENT INTERVENTION (PCI-S) N/A 07/27/2013   Procedure: PERCUTANEOUS CORONARY STENT INTERVENTION (PCI-S);  Surgeon: Blane Ohara, MD;  Location: [facility] CATH LAB;  Service: Cardiovascular;  Laterality: N/A;    Allergies  No Known Allergies  History of Present Illness    Alan Copeland is a 56 y.o. male with a hx of pericardial effusion, hypertension, GERD, coronary artery disease, hyperlipidemia, recurrent idiopathic pericarditis, transaminitis last seen while hospitalized.  CAD with a history of PCI/DES to the RCA in 2013 with ISR in 2015 treated with DES to the RCA.  Most recent cardiac catheterization in 2018 with patent stents.  Hospitalized 12/20/2020 at [facility] with sepsis from pneumonia.  Noted to have pericardial effusion  He was started on 5 days of prednisone and treated with IV ceftriaxone and azithromycin.  Noted left pleural effusion, left lower lobe and lingula collapse, right lung atelectasis.  Continue to experience chest discomfort after hospital discharge.  Readmitted to [facility] 12/29/2020.  Cardiology was consulted due to concern for tamponade and pericardial effusion noted on CT chest.  Dr. Oval Linsey reviewed echocardiogram with no evidence of tamponade.  He does have respiratory inflow variation but IVC not dilated RV not collapsing.  He received prednisone during previous admission at [facility] but  no colchicine which does raise the risk of worsening and recurrent pericarditis.  He was started on colchicine.  CRP and sed rate both trended down during admission.  His atorvastatin was held in the setting of long-term usage of colchicine at discharge.  Elevated LFTs also noted.  Repeat Echocardiogram 8 /22 LVEF  60 to 65%, no R WMA's, RV normal size and function, trivial circumferential pericardial effusion, right atrial pressure 3 mmHg.  He was seen 01/26/21. No recurrent chest pain. BP at home was well controlled. 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   Saw PCP 05/17/21. Lisinopril DC'd due to concern for angioedema/uvular swelling. Amlodipine 5mg  QD added, HCTZ continued. CTA 05/18/21 with no PE, trace right pleural effusion  He presents today for follow up. Reports no shortness of breath nor dyspnea on exertion. Reports no chest pain, pressure, or tightness. No edema, orthopnea, PND. Reports no palpitations.  Not checking BP consistently at home. Tells me the swelling at the back of his throat only lasted one day and has not recurred. Endorses following a heart healthy diet and stays active.   EKGs/Labs/Other Studies Reviewed:   The following studies were reviewed today:  CTA Chest 05/18/21 IMPRESSION:  1. No evidence of pulmonary embolism.  2. Trace right pleural effusion lying dependently.  3. Widespread areas of dependent subsegmental atelectasis in the  lower lobes of the lungs bilaterally.  4. Aortic atherosclerosis, in addition to right coronary artery  disease. Please note that although the presence of coronary artery  calcium documents the presence of coronary artery disease, the  severity of this disease and any potential stenosis cannot be  assessed on this non-gated CT examination. Assessment for potential  risk factor modification, dietary therapy or pharmacologic therapy  may be warranted, if clinically indicated.  5. Hepatic steatosis.   Echo 12/29/20:  1. Left ventricular ejection fraction, by estimation, is 60 to 65%. The  left ventricle has normal function. The left ventricle has no regional  wall motion abnormalities. There is mild concentric left ventricular  hypertrophy. Left ventricular diastolic  parameters are indeterminate.   2. Right  ventricular systolic function is normal. The right ventricular  size is normal. Tricuspid regurgitation signal is inadequate for assessing  PA pressure.   3. The mitral valve is normal in structure. No evidence of mitral valve  regurgitation.   4. The aortic valve is tricuspid. Aortic valve regurgitation is not  visualized. No aortic stenosis is present.   5. The inferior vena cava is normal in size with greater than 50%  respiratory variability, suggesting right atrial pressure of 3 mmHg.   6. Moderate pericardial effusion. There is not evidence of increased  pericardial pressure. There is tissue Doppler annulus Reversus and an  intraventricular septal bounce, these are some, but not all of the factors  seen in constriction. _____________    EKG: EKG ordered today. EKG performed today shows NSR 65 bpm with no acute ST/T wave changes.   Recent Labs: 12/29/2020: Magnesium 2.1; TSH 4.724 01/02/2021: ALT 107; BUN 9; Creatinine, Ser 0.96; Potassium 3.4; Sodium 133 01/03/2021: Hemoglobin 11.3; Platelets 553  Recent Lipid Panel    Component Value Date/Time   CHOL 156 01/31/2018 0910   TRIG 49 01/31/2018 0910   HDL 57 01/31/2018 0910   CHOLHDL 2.7 01/31/2018 0910   CHOLHDL 3.2 11/28/2016 0310   VLDL 9 11/28/2016 0310   LDLCALC 89 01/31/2018 0910    Home Medications  Current Meds  Medication Sig   albuterol (VENTOLIN HFA) 108 (90 Base) MCG/ACT inhaler Inhale 2 puffs into the lungs every 6 (six) hours as needed.   amLODipine (NORVASC) 5 MG tablet Take 1 tablet by mouth daily.   ASPIRIN LOW DOSE 81 MG EC tablet Take 81 mg by mouth daily.   clopidogrel (PLAVIX) 75 MG tablet Take 75 mg by mouth daily with breakfast.   hydrochlorothiazide (MICROZIDE) 12.5 MG capsule Take 12.5 mg by mouth daily.   metFORMIN (GLUCOPHAGE) 500 MG tablet Take 500 mg by mouth at bedtime.   metoprolol tartrate (LOPRESSOR) 25 MG tablet Take 25 mg by mouth 2 (two) times daily.   nitroGLYCERIN (NITROSTAT) 0.4 MG  SL tablet Place 1 tablet (0.4 mg total) under the tongue every 5 (five) minutes as needed for chest pain.   pantoprazole (PROTONIX) 20 MG tablet Take 20 mg by mouth 2 (two) times daily.   Vitamin D, Ergocalciferol, (DRISDOL) 1.25 MG (50000 UNIT) CAPS capsule Take 50,000 Units by mouth once a week.   [DISCONTINUED] amLODipine (NORVASC) 5 MG tablet Take 5 mg by mouth daily.   [DISCONTINUED] aspirin 81 MG tablet Take 81 mg by mouth daily.   [DISCONTINUED] Colchicine 0.6 MG CAPS TAKE 1 CAPSULE BY MOUTH DAILY     Review of Systems      All other systems reviewed and are otherwise negative except as noted above.  Physical Exam    VS:  BP 140/72    Pulse 65    Ht 6\' 2"  (1.88 m)    Wt 221 lb 9.6 oz (100.5 kg)    BMI 28.45 kg/m  , BMI Body mass index is 28.45 kg/m.  Wt Readings from Last 3 Encounters:  06/15/21 221 lb 9.6 oz (100.5 kg)  01/26/21 207 lb 3.2 oz (94 kg)  01/03/21 202 lb 9.6 oz (91.9 kg)   GEN: Well nourished, well developed, in no acute distress. HEENT: normal. Neck: Supple, no JVD, carotid bruits, or masses. Cardiac: RRR, no murmurs, rubs, or gallops. No clubbing, cyanosis, edema.  Radials/PT 2+ and equal bilaterally.  Respiratory:  Respirations regular and unlabored, clear to auscultation bilaterally. GI: Soft, nontender, nondistended. MS: No deformity or atrophy. Skin: Warm and dry, no rash. Neuro:  Strength and sensation are intact. Psych: Normal affect.  Assessment & Plan    Pericardial effusion - Resolved by echo 01/12/21. No recurrent dyspnea.   Recurrent idiopathic pericarditis - Admission 12/2020. He is nearing completion of 6 month course of Colchicine on 07/06/21. At that time he will take one tablet every other day for 2 weeks then discontinue to taper. No recurrent chest pain.   CAD - Stable with no anginal symptoms. No indication for ischemic evaluation.  Cath 2018 with patent stents. GDMT includes aspirin, imdur, metoprolol. Atorvastatin on hold due to  transaminitis and colchicine use. He will resume on February 10th when he completes his colchicine.   HTN - BP mildly elevated. Had throat swelling for one day and PCP was concerned for angioedema so Lisinopril discontinue. He is currently on HCTZ 12.5mg  QD, Lopressor 25mg  BID, and Amlodipine 5mg  QD. He will check BP once daily for 2 weeks and report blood pressure consistently greater than 130/80.  If so plan to increase amlodipine to 5 mg daily.  HLD, LDL goal <70 - 12/2020 LDL 69. Atorvastatin currently on hold due to 6 month course of colchicine. He will resume on February 10th  DM2 - Continue to follow with PCP.   Elevated  TSH- During July admission 4.724. 02/2021 T3 2.87 and T4 1.1 ( both normal). Continue to follow with PCP.   Iron deficiency anemia - Continue to follow with PCP.   Disposition: Follow up in 6 months with Dr. Johnsie Cancel or APP.  Signed, Loel Dubonnet, NP 06/15/2021, 3:16 PM [place]

## 2021-06-15 NOTE — Patient Instructions (Signed)
Medication Instructions:   RESTART Colchicine 0.6 mg daily until January 26th. Then, take 1 tablet every other day for 2 weeks. Then stop.  RESTART your Atorvastatin when you have finished Colchicine.  *If you need a refill on your cardiac medications before your next appointment, please call your pharmacy*  Follow-Up: At [facility], you and your health needs are our priority.  As part of our continuing mission to provide you with exceptional heart care, we have created designated Provider Care Teams.  These Care Teams include your primary Cardiologist (physician) and Advanced Practice Providers (APPs -  Physician Assistants and Nurse Practitioners) who all work together to provide you with the care you need, when you need it.  We recommend signing up for the patient portal called "MyChart".  Sign up information is provided on this After Visit Summary.  MyChart is used to connect with patients for Virtual Visits (Telemedicine).  Patients are able to view lab/test results, encounter notes, upcoming appointments, etc.  Non-urgent messages can be sent to your provider as well.   To learn more about what you can do with MyChart, go to [email or web address].    Your next appointment:   6 month(s)  The format for your next appointment:   In Person  Provider:   Dr. Johnsie Cancel or APP   Other Instructions Please call our office at [phone] if your blood pressure is running consistently high. Thank you!   Tips to Measure your Blood Pressure Correctly  Check your blood pressure at least 2 hours after your morning medications.  Check daily for 2 weeks and if your blood pressure is consistently more than 130/80 please call us.   Here's what you can do to ensure a correct reading:  Don't drink a caffeinated beverage or smoke during the 30 minutes before the test.  Sit quietly for five minutes before the test begins.  During the measurement, sit in a chair with your feet on the floor and  your arm supported so your elbow is at about heart level.  The inflatable part of the cuff should completely cover at least 80% of your upper arm, and the cuff should be placed on bare skin, not over a shirt.  Don't talk during the measurement.  Have your blood pressure measured twice, with a brief break in between. If the readings are different by 5 points or more, have it done a third time.  In 2017, new guidelines from the [place], the [employer] of Cardiology, and nine other health organizations lowered the diagnosis of high blood pressure to 130/80 mm Hg or higher for all adults. The guidelines also redefined the various blood pressure categories to now include normal, elevated, Stage 1 hypertension, Stage 2 hypertension, and hypertensive crisis (see "Blood pressure categories").  Blood pressure categories  Blood pressure category SYSTOLIC (upper number)  DIASTOLIC (lower number)  Normal Less than 120 mm Hg and Less than 80 mm Hg  Elevated 120-129 mm Hg and Less than 80 mm Hg  High blood pressure: Stage 1 hypertension 130-139 mm Hg or 80-89 mm Hg  High blood pressure: Stage 2 hypertension 140 mm Hg or higher or 90 mm Hg or higher  Hypertensive crisis (consult your doctor immediately) Higher than 180 mm Hg and/or Higher than 120 mm Hg  Source: American Heart Association and American Stroke Association. For more on getting your blood pressure under control, buy Controlling Your Blood Pressure, a Special Health Report from [facility].   Blood  Pressure Log   Date   Time  Blood Pressure  Position  Example: Nov 1 9 AM 124/78 sitting

## 2023-02-06 IMAGING — CT CT ANGIO CHEST
2 of 6 series · 18 of 36 positions shown · IV contrast (omnipaque)
Comparison: CT of the chest without contrast on 12/24/2020 and
prior CTA of the chest on 12/20/2020 both at [REDACTED].

CLINICAL DATA: Chest pain and shortness of breath. Recent pneumonia
and diagnosis of pericardial effusion.

EXAM:
CT ANGIOGRAPHY CHEST WITH CONTRAST
TECHNIQUE: Multidetector CT imaging of the chest was performed using the
standard protocol during bolus administration of intravenous
contrast. Multiplanar CT image reconstructions and MIPs were
obtained to evaluate the vascular anatomy.
CONTRAST:  80mL OMNIPAQUE IOHEXOL 350 MG/ML SOLN

[Series 7: pe thins · axial · 0.72mm/px · z∈[+1356,+1585]mm · 17 of 364 slices shown]
[im 19/364  lung]
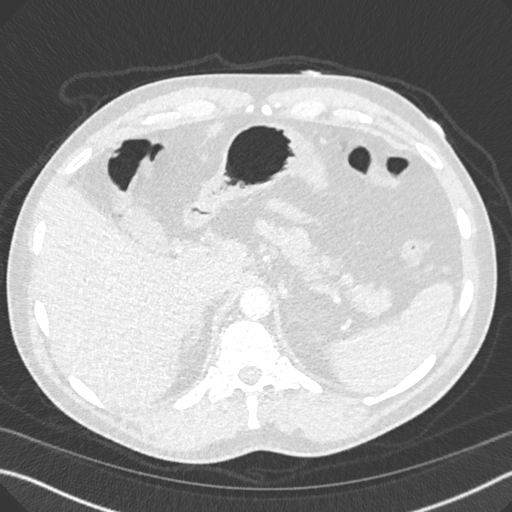
[im 37/364  mediastinal]
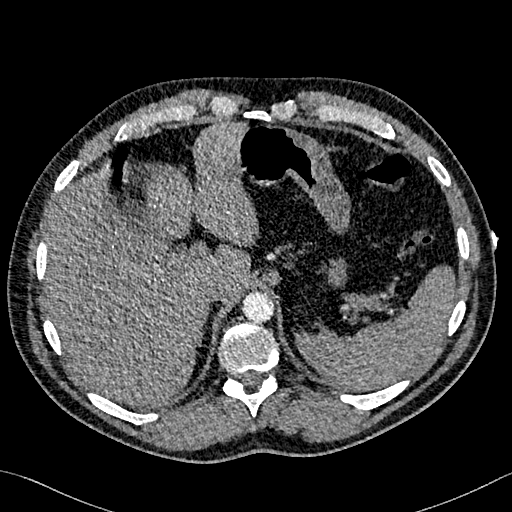
[im 55/364  lung]
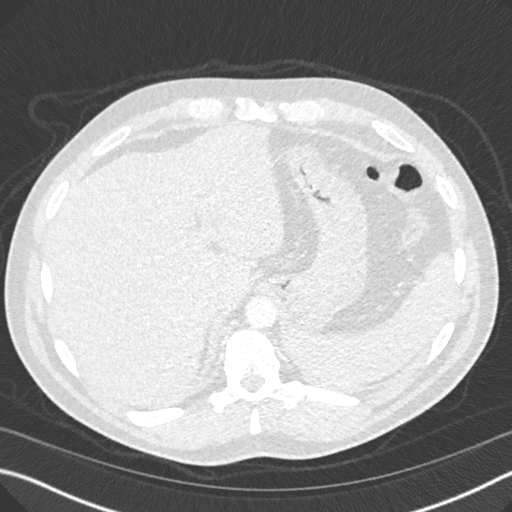
[im 73/364  mediastinal]
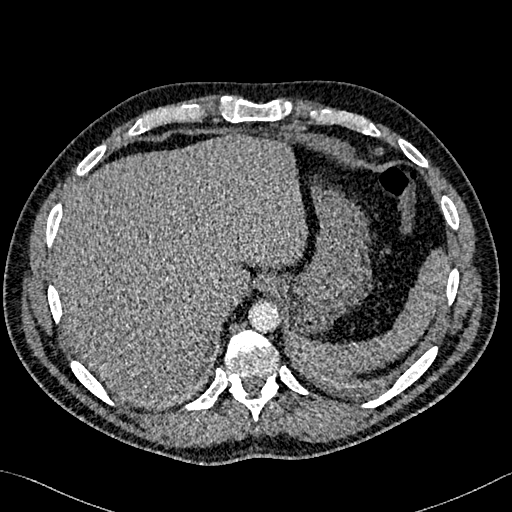
[im 109/364  lung]
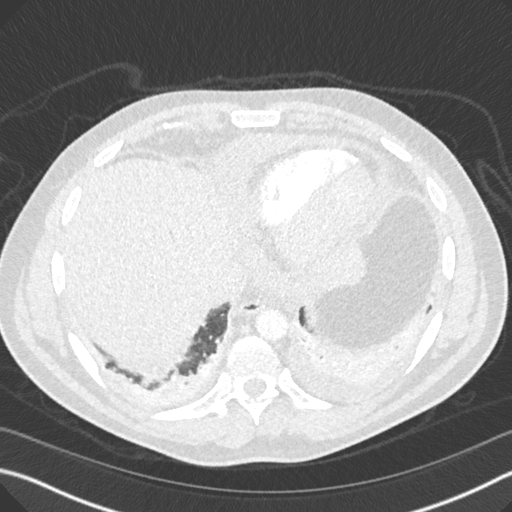
[im 128/364  mediastinal]
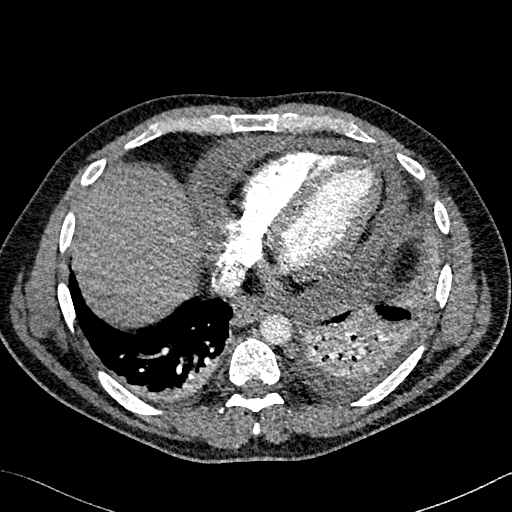
[im 146/364  lung]
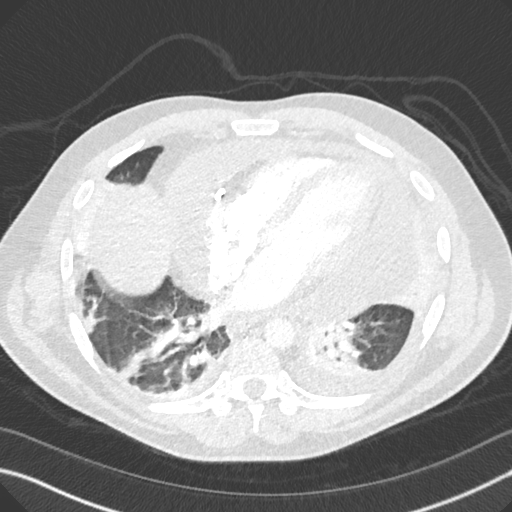
[im 164/364  mediastinal]
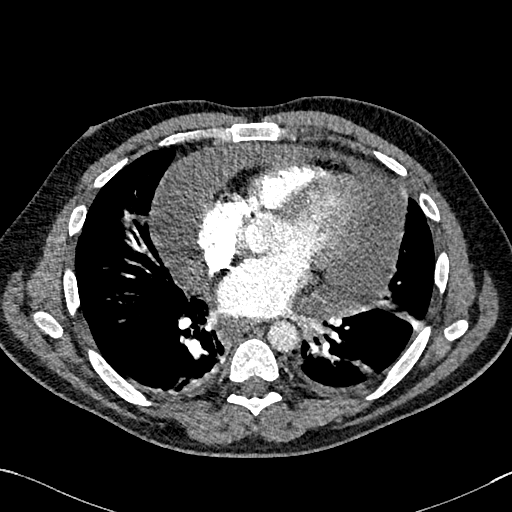
[im 182/364  lung]
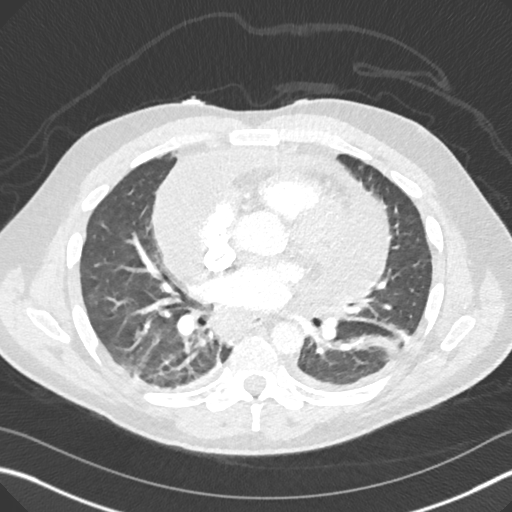
[im 200/364  mediastinal]
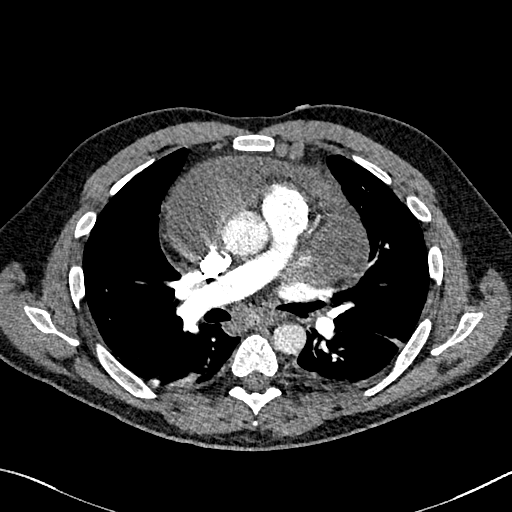
[im 218/364  lung]
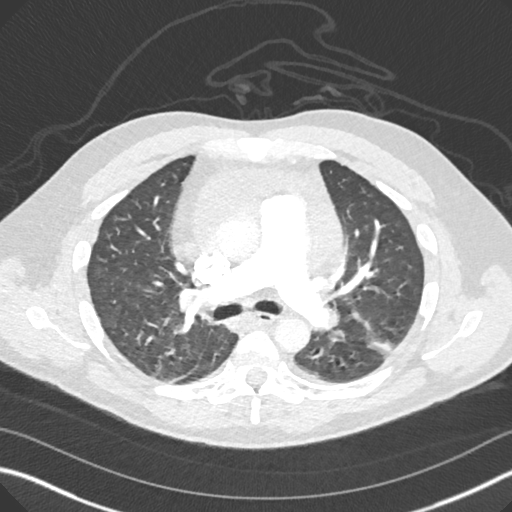
[im 236/364  mediastinal]
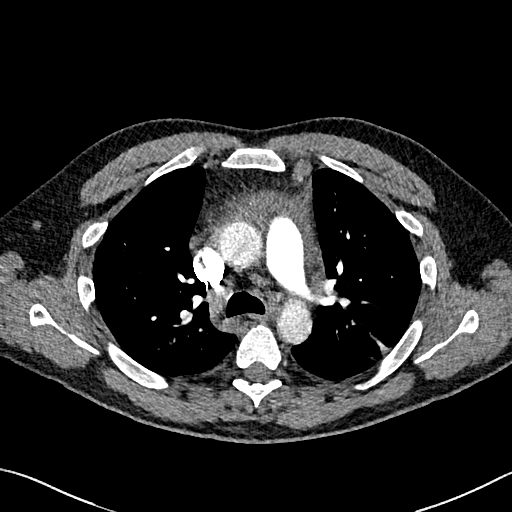
[im 255/364  lung]
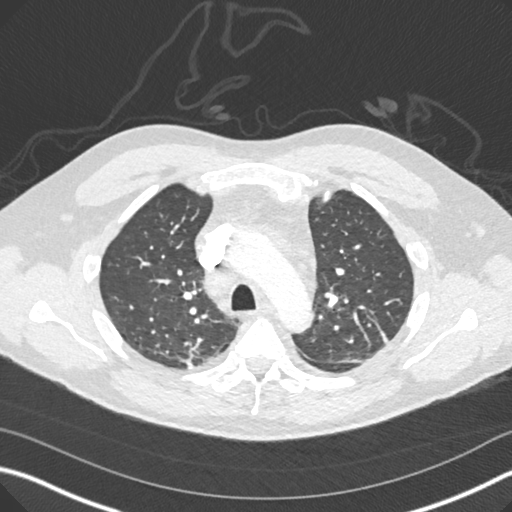
[im 291/364  mediastinal]
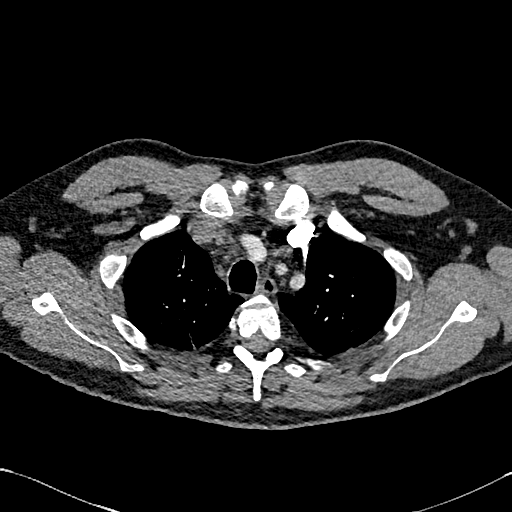
[im 309/364  lung]
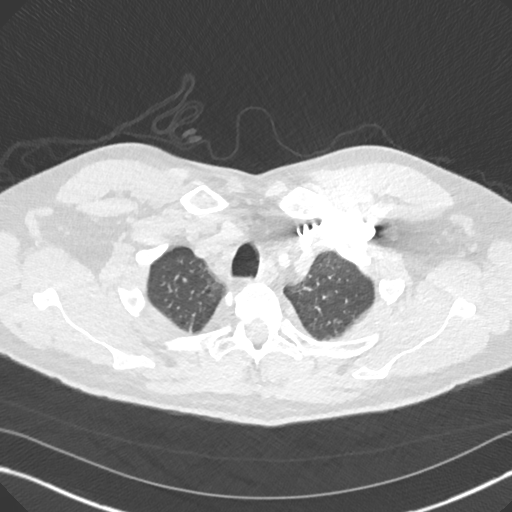
[im 327/364  mediastinal]
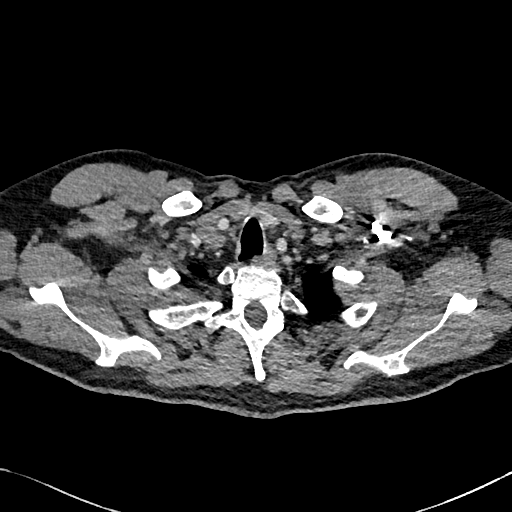
[im 345/364  lung]
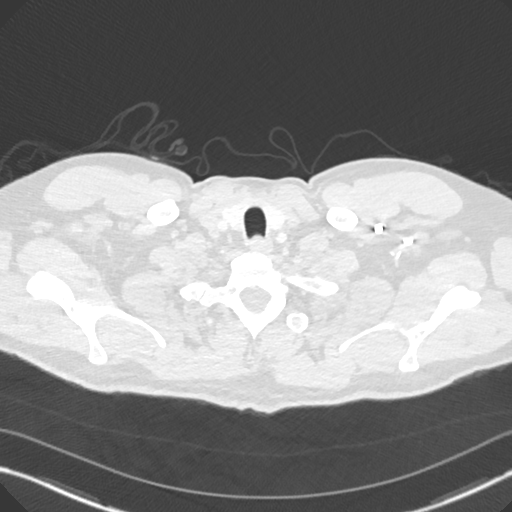

[Series 8: pe 2mm cor · coronal · 0.52mm/px · 1 of 151 slices shown]
[im 76/151  mediastinal]
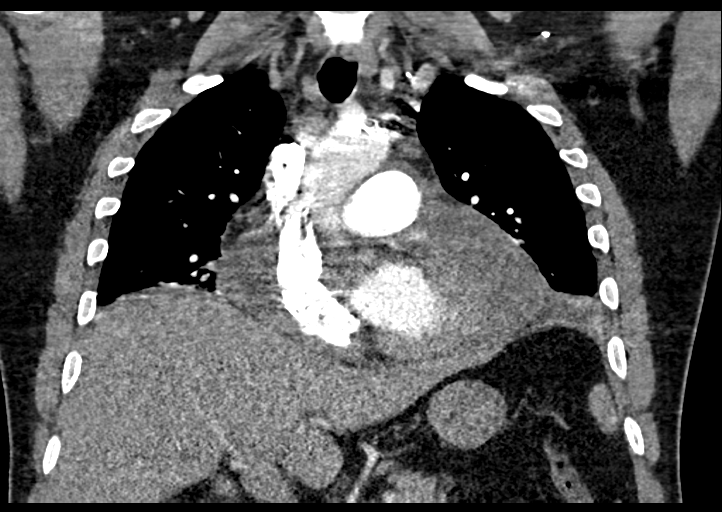

[18 of 36 positions shown; findings below may reference images not displayed]

FINDINGS: Cardiovascular: Large pericardial effusion present with density
measurements again suggesting nonhemorrhagic fluid. Overall volume
appears larger compared to the prior CT studies and there now is
evidence of further narrowing of the cavity volume of both right and
left ventricles suggestive of restrictive physiology/tamponade. No
pericardial masses, enhancement or calcifications identified.

Density related to a previously placed right coronary artery stent
again noted. The thoracic aorta is normal in caliber. Central
pulmonary arteries are normal in caliber. No evidence of pulmonary
embolism.

Mediastinum/Nodes: Enlarged right paratracheal lymph node relatively
stable in size measuring approximately 15 mm in short axis. Enlarged
left internal mammary lymph node appears stable measuring
approximately 11 mm. Other small scattered mediastinal lymph nodes
again identified.

Lungs/Pleura: Persistent atelectasis/consolidation of the left lower
lobe. Small left pleural effusion remains with slight decrease in
volume since the prior studies. Right basilar atelectasis. No
pneumothorax. No visible underlying pulmonary masses.

Upper Abdomen: No acute abnormality.

Musculoskeletal: No chest wall abnormality. No acute or significant
osseous findings.

Review of the MIP images confirms the above findings.
IMPRESSION: 1. Enlargement of large pericardial effusion since prior CT studies
with further evidence of narrowing of right and left ventricular
cavity suggestive of restrictive physiology/tamponade.
2. Stable enlarged right paratracheal and left internal mammary
lymph nodes and other scattered small mediastinal lymph nodes.
3. Stable atelectasis/consolidation of the left lower lobe.
Associated small left pleural effusion shows slight decrease in
volume.
